# Patient Record
Sex: Female | Born: 2000 | Race: Black or African American | Hispanic: No | Marital: Single | Smoking: Never smoker
Health system: Southern US, Community
[De-identification: ages and names within clinical notes are randomized; demographics above are authoritative.]

## PROBLEM LIST (undated history)

## (undated) DIAGNOSIS — B009 Herpesviral infection, unspecified: Secondary | ICD-10-CM

## (undated) DIAGNOSIS — K8020 Calculus of gallbladder without cholecystitis without obstruction: Secondary | ICD-10-CM

## (undated) DIAGNOSIS — F819 Developmental disorder of scholastic skills, unspecified: Secondary | ICD-10-CM

## (undated) HISTORY — DX: Herpesviral infection, unspecified: B00.9

## (undated) HISTORY — PX: CHOLECYSTECTOMY: SHX55

## (undated) HISTORY — DX: Calculus of gallbladder without cholecystitis without obstruction: K80.20

---
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
## 2019-06-15 NOTE — L&D Delivery Note (Signed)
Delivery Note Labor onset: 01/24/2020  Labor Onset Time: 1100 Complete dilation at 9:14 PM  Onset of pushing: 2115-2245, rested from 2300-0000 and resumed pushing @ 0000 FHR second stage Cat 1 Analgesia/Anesthesia intrapartum: epidural  CNM to room to assess pushing efforts after 60 min of laboring down w/ peanut ball. Vertex noted @ +3 station. Pt prepped for delivery. Guided pushing using tug-of-war method continued w/ L. Fails, RN in lithotomy position. Delivery of fetal head @ 0116. Turtle sign noted, RN x2 assisted pt to supine position w/ immediate transition to [place]. Posterior shoulder palpated. Suprapubic pressure and gentle axile traction did not release anterior shoulder. CNM called for Faculty assistance. Leftwich-Kirby, CNM and Dr. Salomon Mast to bedside. [place] from [place], [place]. Delivery of viable female at 60. Baby immediately to warmer w/ NICU team waiting.   Pt's mother and S/O present for birth. S/O is not FOB  Cord blood sample collected: yes Unable to collect arterial cord blood sample, venous gas sent. Results for GARIMA, CHRONIS (MRN [id number]) as of 01/25/2020 02:35  Ref. Range 01/25/2020 01:20  Bicarbonate Latest Ref Range: 13.0 - 22.0 mmol/L 19.5  Ph Cord Blood (Venous) Latest Ref Range: 7.240 - 7.380  7.372  pCO2 Cord Blood (Venous) Latest Ref Range: 42.0 - 56.0  34.4 (L)    Placenta delivered Schultz, intact, with 3 VC.  Placenta to pathology for maternal temp and fetal tachycardia. Uterine tone boggy w/ brisk bleeding. Pitocin bolus, Cytotec 400 mcg buccal, 600 mcg rectal. Uterus firm and bleeding minimal after uterotonics. Bleeding from laceration ligated w/ 4-0 Vicryl.   Vaginal and right clitoral laceration identified.  Anesthesia: epidural Repair: 4-0 and 2-0 vicryl QBL (mL): 660 Complications: Maternal temp, fetal tachycardia, and shoulder dystocia, decreased ROM of fetal left arm noted. Will continue to monitor newborn's ROM. Will cont maternal IV  ATB until 24 hrs afebrile PP.    APGAR: APGAR (1 MIN): 2   APGAR (5 MINS): 8   APGAR (10 MINS):   Mom to postpartum.  Baby to Couplet care / Skin to Skin at this time. Newborn may need ATB therapy if tachycardia does not resolve during transition.  Roma Schanz MSN, CNM 01/25/2020, 2:13 AM

## 2019-07-16 ENCOUNTER — Encounter (HOSPITAL_COMMUNITY): Payer: Self-pay

## 2019-07-18 LAB — OB RESULTS CONSOLE HIV ANTIBODY (ROUTINE TESTING): HIV: NONREACTIVE

## 2019-07-18 LAB — OB RESULTS CONSOLE ABO/RH: RH Type: POSITIVE

## 2019-07-18 LAB — OB RESULTS CONSOLE HEPATITIS B SURFACE ANTIGEN: Hepatitis B Surface Ag: NEGATIVE

## 2019-07-18 LAB — OB RESULTS CONSOLE GC/CHLAMYDIA
Chlamydia: NEGATIVE
Gonorrhea: NEGATIVE

## 2019-07-18 LAB — OB RESULTS CONSOLE RUBELLA ANTIBODY, IGM: Rubella: IMMUNE

## 2019-07-18 LAB — OB RESULTS CONSOLE RPR: RPR: NONREACTIVE

## 2019-07-30 ENCOUNTER — Encounter (HOSPITAL_COMMUNITY): Payer: Self-pay | Admitting: *Deleted

## 2019-07-30 ENCOUNTER — Emergency Department (HOSPITAL_COMMUNITY)
Admission: EM | Admit: 2019-07-30 | Discharge: 2019-07-30 | Disposition: A | Discharge status: Home / Self Care | Payer: Medicaid Other | Attending: Emergency Medicine | Admitting: Emergency Medicine

## 2019-07-30 ENCOUNTER — Other Ambulatory Visit: Payer: Self-pay

## 2019-07-30 DIAGNOSIS — R42 Dizziness and giddiness: Secondary | ICD-10-CM | POA: Diagnosis not present

## 2019-07-30 DIAGNOSIS — Z3A12 12 weeks gestation of pregnancy: Secondary | ICD-10-CM | POA: Insufficient documentation

## 2019-07-30 DIAGNOSIS — O26891 Other specified pregnancy related conditions, first trimester: Secondary | ICD-10-CM | POA: Diagnosis present

## 2019-07-30 DIAGNOSIS — R55 Syncope and collapse: Secondary | ICD-10-CM

## 2019-07-30 LAB — URINALYSIS, ROUTINE W REFLEX MICROSCOPIC
Bilirubin Urine: NEGATIVE
Glucose, UA: NEGATIVE mg/dL
Hgb urine dipstick: NEGATIVE
Ketones, ur: 20 mg/dL — AB
Nitrite: NEGATIVE
Protein, ur: NEGATIVE mg/dL
Specific Gravity, Urine: 1.021 (ref 1.005–1.030)
pH: 5 (ref 5.0–8.0)

## 2019-07-30 NOTE — ED Provider Notes (Signed)
[facility] EMERGENCY DEPARTMENT Provider Note   CSN: [id number] Arrival date & time: 07/30/19  1004     History Chief Complaint  Patient presents with  . Loss of Consciousness    Emily Lucas is a 19 y.o. female.  Patient who is 3 months pregnant, no complications thus far, states ultrasound and initial labs drawn on 1 February --presents to the emergency department today with near syncopal episode.  Patient was standing up at work this morning and felt lightheaded.  She had tunnel vision and felt down to the ground landing on her rear end.  She did not lose consciousness or hit her head.  She was assisted by coworkers.  Patient states that she has only had some grapes, sour patch kids, and prenatal vitamins this morning.  She has not had any recent nausea or vomiting related to her pregnancy or otherwise.  She has been feeling well without any systemic symptoms of illness.  She denies any family history of arrhythmias or heart problems at a young age.  She does not have any heart issues herself.  States that she otherwise feels well and has returned to baseline.  Patient denies any vaginal bleeding or discharge, back pain or pelvic pain.        Past Medical History:  Diagnosis Date  . Gallstones   . Learning disability    "comprehension"- moxther reported    Patient Active Problem List   Diagnosis Date Noted  . Abnormal cholangiogram   . S/P laparoscopic cholecystectomy 01/25/2017    Past Surgical History:  Procedure Laterality Date  . cholecystecomy  01/24/2017  . CHOLECYSTECTOMY    . ERCP N/A 02/01/2017   Procedure: ENDOSCOPIC RETROGRADE CHOLANGIOPANCREATOGRAPHY (ERCP);  Surgeon: Milus Banister, MD;  Location: [facility] ENDOSCOPY;  Service: Endoscopy;  Laterality: N/A;     OB History    Gravida  1   Para  0   Term  0   Preterm  0   AB  0   Living  0     SAB  0   TAB  0   Ectopic  0   Multiple  0   Live Births               Family History  Problem Relation Age of Onset  . Cancer Paternal Grandfather     Social History   Tobacco Use  . Smoking status: Never Smoker  . Smokeless tobacco: Never Used  Substance Use Topics  . Alcohol use: No    Alcohol/week: 0.0 standard drinks  . Drug use: No    Home Medications Prior to Admission medications   Medication Sig Start Date End Date Taking? Authorizing Provider  amoxicillin (AMOXIL) 500 MG capsule Take 1 capsule (500 mg total) by mouth 2 (two) times daily. 08/23/17   Elnora Morrison, MD  HYDROcodone-acetaminophen (NORCO/VICODIN) 5-325 MG tablet Take 1 tablet by mouth every 6 (six) hours as needed for moderate pain.    [provider]  ondansetron (ZOFRAN ODT) 4 MG disintegrating tablet Take 1 tablet (4 mg total) by mouth every 8 (eight) hours as needed for nausea or vomiting. 01/13/17   Carlisle Cater, PA-C    Allergies    Motrin [ibuprofen]  Review of Systems   Review of Systems  Constitutional: Negative for fever.  HENT: Negative for rhinorrhea and sore throat.   Eyes: Negative for redness.  Respiratory: Negative for cough.   Cardiovascular: Negative for chest pain.  Gastrointestinal: Negative  for abdominal pain, diarrhea, nausea and vomiting.  Genitourinary: Negative for dysuria, pelvic pain, vaginal bleeding and vaginal discharge.  Musculoskeletal: Negative for back pain and myalgias.  Skin: Negative for rash.  Neurological: Positive for light-headedness. Negative for dizziness, weakness, numbness and headaches.    Physical Exam Updated Vital Signs BP (!) 101/57 (BP Location: Right Arm)   Pulse 77   Temp 98.3 F (36.8 C) (Oral)   Resp 16   Ht 5\' 4"  (1.626 m)   Wt 69.4 kg   LMP  (LMP Unknown) Comment: First OB appt 07-16-19  SpO2 99%   BMI 26.26 kg/m   Physical Exam Vitals and nursing note reviewed.  Constitutional:      Appearance: She is well-developed. She is not diaphoretic.  HENT:     Head: Normocephalic and atraumatic.      Mouth/Throat:     Mouth: Mucous membranes are not dry.  Eyes:     Conjunctiva/sclera: Conjunctivae normal.  Neck:     Vascular: Normal carotid pulses. No carotid bruit or JVD.     Trachea: Trachea normal. No tracheal deviation.  Cardiovascular:     Rate and Rhythm: Normal rate and regular rhythm.     Pulses: No decreased pulses.     Heart sounds: Normal heart sounds, S1 normal and S2 normal. No murmur.     Comments: No murmurs heard.  Regular rate and rhythm. Pulmonary:     Effort: Pulmonary effort is normal. No respiratory distress.     Breath sounds: No wheezing.  Chest:     Chest wall: No tenderness.  Abdominal:     General: Bowel sounds are normal.     Palpations: Abdomen is soft.     Tenderness: There is no abdominal tenderness. There is no guarding or rebound.  Musculoskeletal:        General: Normal range of motion.     Cervical back: Normal range of motion and neck supple. No muscular tenderness.  Skin:    General: Skin is warm and dry.     Coloration: Skin is not pale.  Neurological:     Mental Status: She is alert.     ED Results / Procedures / Treatments   Labs (all labs ordered are listed, but only abnormal results are displayed) Labs Reviewed  URINALYSIS, ROUTINE W REFLEX MICROSCOPIC - Abnormal; Notable for the following components:      Result Value   APPearance HAZY (*)    Ketones, ur 20 (*)    Leukocytes,Ua TRACE (*)    Bacteria, UA FEW (*)    All other components within normal limits    ED ECG REPORT   Date: 07/30/2019  Rate: 76  Rhythm: normal sinus rhythm  QRS Axis: normal  Intervals: normal  ST/T Wave abnormalities: normal  Conduction Disutrbances:none  Narrative Interpretation: No WPW, Brugada syndrome, prolonged QT, hypertrophy, heart block  Old EKG Reviewed: none available  I have personally reviewed the EKG tracing and agree with the computerized printout as noted.  Radiology No results found.  Procedures Procedures (including  critical care time)  Medications Ordered in ED Medications - No data to display  ED Course  I have reviewed the triage vital signs and the nursing notes.  Pertinent labs & imaging results that were available during my care of the patient were reviewed by me and considered in my medical decision making (see chart for details).  Patient seen and examined.  Patient looks well.  She is pregnant.  No reported  complications.  She reports an normal ultrasound about 2 weeks ago.  No pain and I have no reason to suspect extrauterine pregnancy today.  Vital signs reviewed and are as follows: BP (!) 101/57 (BP Location: Right Arm)   Pulse 77   Temp 98.3 F (36.8 C) (Oral)   Resp 16   Ht 5\' 4"  (1.626 m)   Wt 69.4 kg   LMP  (LMP Unknown) Comment: First OB appt 07-16-19  SpO2 99%   BMI 26.26 kg/m   Suspect near syncope likely multifactorial, due to poor oral intake, pregnancy status, working while standing.  Will check orthostatics, EKG, UA.  Will encourage fluid intake.  Do not feel that extensive work-up otherwise indicated patient is back to her baseline.  Will reassess.  EKG is normal.   Orthostatic VS for the past 24 hrs:  BP- Lying Pulse- Lying BP- Sitting Pulse- Sitting BP- Standing at 0 minutes Pulse- Standing at 0 minutes  07/30/19 1022 105/61 73 122/68 77 105/77 97   1:04 PM Awaiting UA. Patient drinking water, no problems. Resting in room.   1:09 PM patient updated on results.  She is ready to go home.  Encouraged rest and hydration today.  Encouraged follow-up with OB/GYN.  Encouraged return to the emergency department with chest pain, abdominal pain, vaginal bleeding or discharge, additional episodes of passing out.    MDM Rules/Calculators/A&P                      Patient with near syncopal episode in setting of poor oral intake, early pregnancy, standing while at work today.  No chest pain or concerning symptoms.  No recent illnesses.  No significant vomiting or diarrhea.  EKG  is normal.  Patient is mildly orthostatic.  Urine with some ketones, no definite infection, not a clean-catch.  Patient has been monitored in the emergency department without any worsening.  She is at her baseline and looks well.  She has been drinking without any difficulties.  No indications for blood testing today as I doubt clinical anemia or electrolyte abnormality.    Final Clinical Impression(s) / ED Diagnoses Final diagnoses:  Near syncope    Rx / DC Orders ED Discharge Orders    None       08/01/19, PA-C 07/30/19 1310    08/01/19, MD 07/31/19 [phone]

## 2019-07-30 NOTE — Discharge Instructions (Signed)
Please read and follow all provided instructions.  Your diagnoses today include:  1. Near syncope     Tests performed today include:  EKG - normal  Urine test -no definite infection, mild dehydration   Vital signs. See below for your results today.   Medications prescribed:   None  Take any prescribed medications only as directed.  Home care instructions:  Follow any educational materials contained in this packet.  Please rest and hydrate yourself for the remainder of the day today.  Follow-up instructions: Please follow-up with your primary care provider in the next 3 days for further evaluation of your symptoms.    Return instructions:   Please return to the Emergency Department if you experience worsening symptoms.   Return if you develop any abdominal pain, back pain, vaginal bleeding or discharge.  Return if you have additional episodes of passing out or nearly passing out.  Please return if you have any other emergent concerns.  Additional Information:  Your vital signs today were: BP 104/60 (BP Location: Right Arm)   Pulse 71   Temp 97.8 F (36.6 C) (Oral)   Resp 19   Ht 5\' 4"  (1.626 m)   Wt 69.4 kg   LMP  (LMP Unknown) Comment: First OB appt 07-16-19  SpO2 100%   BMI 26.26 kg/m  If your blood pressure (BP) was elevated above 135/85 this visit, please have this repeated by your doctor within one month. --------------

## 2019-07-30 NOTE — ED Triage Notes (Signed)
Pt reports while at work pt felt dizzy and sat down on floor. Pt does remember sitting down but reported she heard some one saying she needs help.

## 2019-07-30 NOTE — ED Notes (Signed)
Patient verbalizes understanding of discharge instructions . Opportunity for questions and answers were provided . Armband removed by staff ,Pt discharged from ED. W/C  offered at D/C  and Declined W/C at D/C and was escorted to lobby by RN.  

## 2019-08-16 ENCOUNTER — Other Ambulatory Visit (HOSPITAL_COMMUNITY): Payer: Self-pay | Admitting: Obstetrics and Gynecology

## 2019-08-16 DIAGNOSIS — Z3A19 19 weeks gestation of pregnancy: Secondary | ICD-10-CM

## 2019-08-16 DIAGNOSIS — Z3689 Encounter for other specified antenatal screening: Secondary | ICD-10-CM

## 2019-09-10 ENCOUNTER — Other Ambulatory Visit: Payer: Self-pay

## 2019-09-10 ENCOUNTER — Other Ambulatory Visit (HOSPITAL_COMMUNITY): Payer: Self-pay | Admitting: *Deleted

## 2019-09-10 ENCOUNTER — Ambulatory Visit (HOSPITAL_COMMUNITY)
Admission: RE | Admit: 2019-09-10 | Discharge: 2019-09-10 | Disposition: A | Discharge status: Home / Self Care | Payer: Medicaid Other | Source: Ambulatory Visit | Attending: Obstetrics and Gynecology | Admitting: Obstetrics and Gynecology

## 2019-09-10 DIAGNOSIS — Z3689 Encounter for other specified antenatal screening: Secondary | ICD-10-CM | POA: Diagnosis present

## 2019-09-10 DIAGNOSIS — Z362 Encounter for other antenatal screening follow-up: Secondary | ICD-10-CM

## 2019-09-10 DIAGNOSIS — Z3A19 19 weeks gestation of pregnancy: Secondary | ICD-10-CM | POA: Diagnosis present

## 2019-09-10 DIAGNOSIS — Z3A20 20 weeks gestation of pregnancy: Secondary | ICD-10-CM | POA: Diagnosis not present

## 2019-10-05 ENCOUNTER — Encounter (HOSPITAL_COMMUNITY): Payer: Self-pay | Admitting: *Deleted

## 2019-10-09 ENCOUNTER — Ambulatory Visit (HOSPITAL_COMMUNITY)
Admission: RE | Admit: 2019-10-09 | Discharge: 2019-10-09 | Disposition: A | Discharge status: Home / Self Care | Payer: Medicaid Other | Source: Ambulatory Visit | Attending: Obstetrics and Gynecology | Admitting: Obstetrics and Gynecology

## 2019-10-09 ENCOUNTER — Other Ambulatory Visit: Payer: Self-pay

## 2019-10-09 ENCOUNTER — Ambulatory Visit (HOSPITAL_COMMUNITY): Payer: Medicaid Other

## 2019-10-09 DIAGNOSIS — Z3A24 24 weeks gestation of pregnancy: Secondary | ICD-10-CM

## 2019-10-09 DIAGNOSIS — Z362 Encounter for other antenatal screening follow-up: Secondary | ICD-10-CM

## 2019-11-20 LAB — OB RESULTS CONSOLE HIV ANTIBODY (ROUTINE TESTING): HIV: NONREACTIVE

## 2019-11-20 LAB — OB RESULTS CONSOLE RPR: RPR: NONREACTIVE

## 2020-01-07 LAB — OB RESULTS CONSOLE GC/CHLAMYDIA
Chlamydia: NEGATIVE
Gonorrhea: NEGATIVE

## 2020-01-07 LAB — OB RESULTS CONSOLE GBS: GBS: POSITIVE

## 2020-01-22 ENCOUNTER — Other Ambulatory Visit: Payer: Self-pay

## 2020-01-22 ENCOUNTER — Encounter (HOSPITAL_COMMUNITY): Payer: Self-pay | Admitting: Obstetrics & Gynecology

## 2020-01-22 ENCOUNTER — Inpatient Hospital Stay (HOSPITAL_COMMUNITY)
Admission: AD | Admit: 2020-01-22 | Discharge: 2020-01-22 | Disposition: A | Discharge status: Home / Self Care | Payer: Medicaid Other | Attending: Obstetrics & Gynecology | Admitting: Obstetrics & Gynecology

## 2020-01-22 DIAGNOSIS — Z3A39 39 weeks gestation of pregnancy: Secondary | ICD-10-CM

## 2020-01-22 DIAGNOSIS — Z3403 Encounter for supervision of normal first pregnancy, third trimester: Secondary | ICD-10-CM | POA: Insufficient documentation

## 2020-01-22 DIAGNOSIS — Z0371 Encounter for suspected problem with amniotic cavity and membrane ruled out: Secondary | ICD-10-CM | POA: Diagnosis not present

## 2020-01-22 DIAGNOSIS — Z3689 Encounter for other specified antenatal screening: Secondary | ICD-10-CM

## 2020-01-22 LAB — POCT FERN TEST: POCT Fern Test: NEGATIVE

## 2020-01-22 NOTE — MAU Note (Addendum)
I have communicated with Donia Ast, NP and reviewed vital signs:  Vitals:   01/22/20 1942 01/22/20 2123  BP: 109/68 113/72  Pulse: 89 78  Resp:  15  Temp:    SpO2: 99% 100%    Vaginal exam:  Dilation: 1 Effacement (%): 50 Cervical Position: Posterior Station: -3 Presentation: Vertex Exam by:: Erle Crocker, RN,   Also reviewed contraction pattern and that non-stress test is reactive.  It has been documented that patient is contracting every 7 minutes with no cervical change over 1 hour not indicating active labor.  Patient denies any other complaints.  Based on this report provider has given order for discharge.  A discharge order and diagnosis entered by a provider.   Labor discharge instructions reviewed with patient.

## 2020-01-22 NOTE — MAU Provider Note (Signed)
S: Ms. Emily Lucas is a 19 y.o. G1P0000 at [redacted]w[redacted]d  who presents to MAU today complaining of leaking of fluid since 4days. Patient reports she feels "wetter than usual" and endorses a white/clear discharge that does not soak her clothes or result in large gushes. She denies vaginal bleeding. She endorses contractions in her back. She reports normal fetal movement.    O: BP 109/68   Pulse 89   Temp 98.5 F (36.9 C) (Oral)   Resp 15   Ht 5\' 4"  (1.626 m)   Wt 75.3 kg   LMP  (LMP Unknown) Comment: First OB appt 07-16-19  SpO2 99%   BMI 28.49 kg/m    Patient Vitals for the past 24 hrs:  BP Temp Temp src Pulse Resp SpO2 Height Weight  01/22/20 1942 109/68 -- -- 89 -- 99 % -- --  01/22/20 1930 110/64 98.5 F (36.9 C) Oral 88 15 100 % 5\' 4"  (1.626 m) 75.3 kg   GENERAL: Well-developed, well-nourished female in no acute distress.  HEAD: Normocephalic, atraumatic.  CHEST: Normal effort of breathing, regular heart rate ABDOMEN: Soft, nontender, gravid PELVIC: Normal external female genitalia. Vagina is pink and rugated. Cervix with normal contour, no lesions. Normal discharge.  No pooling.   Cervical exam:  Dilation: 1 Effacement (%): 50 Cervical Position: Posterior Station: -3 Presentation: Vertex Exam by:: , RN   Fetal Monitoring: reactive Baseline: 135 Variability: moderate Accelerations: present, 15x15 Decelerations: absent Contractions: q4-75min, pt rates as 1/10  Results for orders placed or performed during the hospital encounter of 01/22/20 (from the past 24 hour(s))  Fern Test     Status: None   Collection Time: 01/22/20  8:23 PM  Result Value Ref Range   POCT Fern Test Negative = intact amniotic membranes     A: SIUP at [redacted]w[redacted]d  Membranes intact  P: Report given to RN to contact MD on call for further instructions  Lya Holben, 03/23/20, NP 01/22/2020 8:47 PM

## 2020-01-22 NOTE — MAU Note (Signed)
Pt reports cramping x 3 days off/on. Also wants to see if her water has broken because she feels a lot of wetness . Reports good fetal movement.

## 2020-01-22 NOTE — Discharge Instructions (Signed)

## 2020-01-24 ENCOUNTER — Inpatient Hospital Stay (HOSPITAL_COMMUNITY): Payer: Medicaid Other | Admitting: Anesthesiology

## 2020-01-24 ENCOUNTER — Inpatient Hospital Stay (HOSPITAL_COMMUNITY)
Admission: AD | Admit: 2020-01-24 | Discharge: 2020-01-27 | DRG: 805 | Disposition: A | Discharge status: Home / Self Care | Payer: Medicaid Other | Attending: Obstetrics and Gynecology | Admitting: Obstetrics and Gynecology

## 2020-01-24 ENCOUNTER — Other Ambulatory Visit: Payer: Self-pay

## 2020-01-24 ENCOUNTER — Encounter (HOSPITAL_COMMUNITY): Payer: Self-pay | Admitting: Obstetrics and Gynecology

## 2020-01-24 DIAGNOSIS — O411230 Chorioamnionitis, third trimester, not applicable or unspecified: Secondary | ICD-10-CM | POA: Diagnosis present

## 2020-01-24 DIAGNOSIS — O99824 Streptococcus B carrier state complicating childbirth: Secondary | ICD-10-CM | POA: Diagnosis present

## 2020-01-24 DIAGNOSIS — O9081 Anemia of the puerperium: Secondary | ICD-10-CM | POA: Diagnosis not present

## 2020-01-24 DIAGNOSIS — Z20822 Contact with and (suspected) exposure to covid-19: Secondary | ICD-10-CM | POA: Diagnosis present

## 2020-01-24 DIAGNOSIS — Z3400 Encounter for supervision of normal first pregnancy, unspecified trimester: Secondary | ICD-10-CM

## 2020-01-24 DIAGNOSIS — O26893 Other specified pregnancy related conditions, third trimester: Secondary | ICD-10-CM | POA: Diagnosis present

## 2020-01-24 DIAGNOSIS — Z3A39 39 weeks gestation of pregnancy: Secondary | ICD-10-CM

## 2020-01-24 DIAGNOSIS — D62 Acute posthemorrhagic anemia: Secondary | ICD-10-CM | POA: Diagnosis not present

## 2020-01-24 DIAGNOSIS — O9902 Anemia complicating childbirth: Secondary | ICD-10-CM | POA: Diagnosis present

## 2020-01-24 DIAGNOSIS — O411231 Chorioamnionitis, third trimester, fetus 1: Secondary | ICD-10-CM | POA: Diagnosis present

## 2020-01-24 LAB — CBC
HCT: 33.5 % — ABNORMAL LOW (ref 36.0–46.0)
Hemoglobin: 10.7 g/dL — ABNORMAL LOW (ref 12.0–15.0)
MCH: 28.2 pg (ref 26.0–34.0)
MCHC: 31.9 g/dL (ref 30.0–36.0)
MCV: 88.4 fL (ref 80.0–100.0)
Platelets: 206 10*3/uL (ref 150–400)
RBC: 3.79 MIL/uL — ABNORMAL LOW (ref 3.87–5.11)
RDW: 13.6 % (ref 11.5–15.5)
WBC: 13.9 10*3/uL — ABNORMAL HIGH (ref 4.0–10.5)
nRBC: 0 % (ref 0.0–0.2)

## 2020-01-24 LAB — SARS CORONAVIRUS 2 BY RT PCR (HOSPITAL ORDER, PERFORMED IN ~~LOC~~ HOSPITAL LAB): SARS Coronavirus 2: NEGATIVE

## 2020-01-24 LAB — POCT FERN TEST: POCT Fern Test: POSITIVE

## 2020-01-24 MED ORDER — LIDOCAINE HCL (PF) 1 % IJ SOLN: 30.0000 mL | INTRAMUSCULAR | Status: DC | PRN

## 2020-01-24 MED ORDER — OXYTOCIN-SODIUM CHLORIDE 30-0.9 UT/500ML-% IV SOLN: 2.5000 [IU]/h | INTRAVENOUS | Status: DC

## 2020-01-24 MED ORDER — PHENYLEPHRINE 40 MCG/ML (10ML) SYRINGE FOR IV PUSH (FOR BLOOD PRESSURE SUPPORT): 80.0000 ug | PREFILLED_SYRINGE | INTRAVENOUS | Status: DC | PRN

## 2020-01-24 MED ORDER — SODIUM CHLORIDE (PF) 0.9 % IJ SOLN
INTRAMUSCULAR | Status: DC | PRN
  Administered 2020-01-24: 12 mL/h via EPIDURAL

## 2020-01-24 MED ORDER — ONDANSETRON HCL 4 MG/2ML IJ SOLN: 4.0000 mg | Freq: Four times a day (QID) | INTRAMUSCULAR | Status: DC | PRN

## 2020-01-24 MED ORDER — OXYTOCIN-SODIUM CHLORIDE 30-0.9 UT/500ML-% IV SOLN
1.0000 m[IU]/min | INTRAVENOUS | Status: DC
  Administered 2020-01-24: 1 m[IU]/min via INTRAVENOUS
  Filled 2020-01-24: qty 500

## 2020-01-24 MED ORDER — FENTANYL-BUPIVACAINE-NACL 0.5-0.125-0.9 MG/250ML-% EP SOLN
12.0000 mL/h | EPIDURAL | Status: DC | PRN
  Filled 2020-01-24: qty 250

## 2020-01-24 MED ORDER — SOD CITRATE-CITRIC ACID 500-334 MG/5ML PO SOLN: 30.0000 mL | ORAL | Status: DC | PRN

## 2020-01-24 MED ORDER — TERBUTALINE SULFATE 1 MG/ML IJ SOLN: 0.2500 mg | Freq: Once | INTRAMUSCULAR | Status: DC | PRN

## 2020-01-24 MED ORDER — OXYTOCIN BOLUS FROM INFUSION
333.0000 mL | Freq: Once | INTRAVENOUS | Status: AC
  Administered 2020-01-25: 333 mL via INTRAVENOUS

## 2020-01-24 MED ORDER — LACTATED RINGERS IV SOLN
500.0000 mL | INTRAVENOUS | Status: DC | PRN
  Administered 2020-01-24: 1000 mL via INTRAVENOUS

## 2020-01-24 MED ORDER — FENTANYL CITRATE (PF) 100 MCG/2ML IJ SOLN: 50.0000 ug | INTRAMUSCULAR | Status: DC | PRN

## 2020-01-24 MED ORDER — DIPHENHYDRAMINE HCL 50 MG/ML IJ SOLN: 12.5000 mg | INTRAMUSCULAR | Status: DC | PRN

## 2020-01-24 MED ORDER — LIDOCAINE HCL (PF) 1 % IJ SOLN
INTRAMUSCULAR | Status: DC | PRN
  Administered 2020-01-24: 6 mL via EPIDURAL
  Administered 2020-01-24: 4 mL via EPIDURAL

## 2020-01-24 MED ORDER — SODIUM CHLORIDE 0.9 % IV SOLN
3.0000 g | Freq: Four times a day (QID) | INTRAVENOUS | Status: DC
  Administered 2020-01-24 – 2020-01-26 (×7): 3 g via INTRAVENOUS
  Filled 2020-01-24: qty 8
  Filled 2020-01-24: qty 3
  Filled 2020-01-24 (×7): qty 8
  Filled 2020-01-24 (×3): qty 3
  Filled 2020-01-24: qty 8
  Filled 2020-01-24: qty 3

## 2020-01-24 MED ORDER — EPHEDRINE 5 MG/ML INJ: 10.0000 mg | INTRAVENOUS | Status: DC | PRN

## 2020-01-24 MED ORDER — ACETAMINOPHEN 160 MG/5ML PO SOLN
1000.0000 mg | Freq: Three times a day (TID) | ORAL | Status: DC | PRN
  Administered 2020-01-25 – 2020-01-26 (×3): 1000 mg via ORAL
  Filled 2020-01-24 (×3): qty 40.6

## 2020-01-24 MED ORDER — PENICILLIN G POT IN DEXTROSE 60000 UNIT/ML IV SOLN
3.0000 10*6.[IU] | INTRAVENOUS | Status: DC
  Filled 2020-01-24: qty 50

## 2020-01-24 MED ORDER — FLEET ENEMA 7-19 GM/118ML RE ENEM: 1.0000 | ENEMA | RECTAL | Status: DC | PRN

## 2020-01-24 MED ORDER — LACTATED RINGERS IV SOLN
500.0000 mL | Freq: Once | INTRAVENOUS | Status: AC
  Administered 2020-01-24: 500 mL via INTRAVENOUS

## 2020-01-24 MED ORDER — LACTATED RINGERS IV BOLUS
1000.0000 mL | Freq: Once | INTRAVENOUS | Status: AC
  Administered 2020-01-24: 1000 mL via INTRAVENOUS

## 2020-01-24 MED ORDER — ACETAMINOPHEN 160 MG/5ML PO SOLN
650.0000 mg | Freq: Four times a day (QID) | ORAL | Status: DC | PRN
  Administered 2020-01-24 (×2): 650 mg via ORAL
  Filled 2020-01-24 (×3): qty 20.3

## 2020-01-24 MED ORDER — LACTATED RINGERS IV SOLN: INTRAVENOUS | Status: DC

## 2020-01-24 MED ORDER — SODIUM CHLORIDE 0.9 % IV SOLN
5.0000 10*6.[IU] | Freq: Once | INTRAVENOUS | Status: AC
  Administered 2020-01-24: 5 10*6.[IU] via INTRAVENOUS
  Filled 2020-01-24: qty 5

## 2020-01-24 NOTE — MAU Note (Signed)
.   Emily Lucas is a 19 y.o. at [redacted]w[redacted]d here in MAU reporting: ctx that started 1700 yesterday evening. She states that they are closer together and have increased in intensity. She also states her water broke at 0830 clear fluid. Endorses good fetal movement.   Pain score: 5 Vitals:   01/24/20 0858  BP: 118/66  Pulse: (!) 113  Resp: 19  Temp: 99.5 F (37.5 C)  SpO2: 100%     FHT: 185

## 2020-01-24 NOTE — Progress Notes (Signed)
Subjective:    Requesting an epidural. Discussed change in antibiotic therapy to treat possible chorio. Pt agrees w/ plan.   Objective:    VS: BP 124/74   Pulse (!) 114   Temp 99.3 F (37.4 C) (Oral)   Resp 20   Ht 5\' 4"  (1.626 m)   Wt 75.8 kg   LMP  (LMP Unknown) Comment: First OB appt 07-16-19  SpO2 100%   BMI 28.67 kg/m  FHR : baseline 155 / variability moderate / accelerations present / absemt decelerations Toco: contractions every 2-4 minutes  Membranes: SROM since 1700 01/23/20. No fluid noted on exam, palpable forebag noted.   Dilation: 3 Effacement (%): 80 Cervical Position: Anterior Station: -2 Presentation: Vertex Exam by:: [email or web address], CNM  Assessment/Plan:   19 y.o. G1P0000 [redacted]w[redacted]d SROM Teen pregnancy Possible chorio Labor: Latent labor, will start Pitocin after epidural Preeclampsia:  no signs or symptoms of toxicity Fetal Wellbeing:  Category I Pain Control:  Epidural I/D:  GBS pos, PCN loading dose given, switching to Unasyn for poss chorio Anticipated MOD:  NSVD  Will start Pitocin after epidural  [redacted]w[redacted]d MSN, CNM 01/24/2020 12:55 PM

## 2020-01-24 NOTE — Anesthesia Preprocedure Evaluation (Signed)
Anesthesia Evaluation  Patient identified by MRN, date of birth, ID band Patient awake    Reviewed: Allergy & Precautions, H&P , NPO status , Patient's Chart, lab work & pertinent test results  History of Anesthesia Complications Negative for: history of anesthetic complications  Airway Mallampati: II  TM Distance: >3 FB Neck ROM: full    Dental no notable dental hx.    Pulmonary neg pulmonary ROS,    Pulmonary exam normal        Cardiovascular negative cardio ROS Normal cardiovascular exam Rhythm:regular Rate:Normal     Neuro/Psych negative neurological ROS  negative psych ROS   GI/Hepatic negative GI ROS, Neg liver ROS,   Endo/Other  negative endocrine ROS  Renal/GU negative Renal ROS  negative genitourinary   Musculoskeletal   Abdominal   Peds  Hematology  (+) Blood dyscrasia, anemia ,   Anesthesia Other Findings   Reproductive/Obstetrics (+) Pregnancy                             Anesthesia Physical Anesthesia Plan  ASA: II  Anesthesia Plan: Epidural   Post-op Pain Management:    Induction:   PONV Risk Score and Plan:   Airway Management Planned:   Additional Equipment:   Intra-op Plan:   Post-operative Plan:   Informed Consent: I have reviewed the patients History and Physical, chart, labs and discussed the procedure including the risks, benefits and alternatives for the proposed anesthesia with the patient or authorized representative who has indicated his/her understanding and acceptance.       Plan Discussed with:   Anesthesia Plan Comments:         Anesthesia Quick Evaluation  

## 2020-01-24 NOTE — Progress Notes (Signed)
Subjective:    Called to bedside for pt's report of increased rectal pressure. Voluntary pushing initiated w/ CNM present. CNM remains at bedside for first 75 min of coached pushing.   Objective:    VS: BP (!) 108/57   Pulse (!) 106   Temp 99.6 F (37.6 C) (Axillary)   Resp 18   Ht 5\' 4"  (1.626 m)   Wt 75.8 kg   LMP  (LMP Unknown) Comment: First OB appt 07-16-19  SpO2 100%   BMI 28.67 kg/m  FHR : baseline 130 / variability moderate / accelerations present / absent, occasional early decelerations Toco: contractions every 2-6 minutes  Membranes: SROM, clear Dilation: 10 Dilation Complete Date: 01/24/20 Dilation Complete Time: 2114 Effacement (%): 90 Cervical Position: Anterior Station: Plus 2 Presentation: Vertex Exam by:: [email or web address], CNM Pitocin 6 mU/min  Assessment/Plan:   19 y.o. G1P0000 [redacted]w[redacted]d SROM Teen pregnancy Chorio- plan for ATB PP Labor: Progressing normally Preeclampsia:  no signs or symptoms of toxicity Fetal Wellbeing:  Category I Pain Control:  Epidural I/D:  GBS pos, adequately treated Anticipated MOD:  NSVD  [redacted]w[redacted]d MSN, CNM 01/24/2020 10:44 PM

## 2020-01-24 NOTE — Progress Notes (Addendum)
Subjective:    Comfortable w/ epidural. Mother and S/O present. Pt has informed RN that S/O is not FOB.   Objective:    VS: BP 123/74   Pulse (!) 115   Temp (!) 100.8 F (38.2 C) (Axillary) Comment (Src): pt drinking ice water  Resp 18   Ht 5\' 4"  (1.626 m)   Wt 75.8 kg   LMP  (LMP Unknown) Comment: First OB appt 07-16-19  SpO2 100%   BMI 28.67 kg/m  FHR : baseline 155 / variability moderate / accelerations present / absent decelerations Toco: contractions every 1-2 minutes Membranes: SROM, clear Dilation: 7 Effacement (%): 90 Cervical Position: Anterior Station: 0, Plus 1 Presentation: Vertex Exam by:: [email or web address], CNM Pitocin 4 mU/min   Assessment/Plan:   19 y.o. G1P0000 [redacted]w[redacted]d SROM Teen pregnancy Possible chorioamnionitis Labor: Progressing normally on Pitocin Preeclampsia:  no signs or symptoms of toxicity Fetal Wellbeing:  Category I Pain Control:  Epidural I/D:  GBS postivie, adequate treatment Anticipated MOD:  NSVD  [redacted]w[redacted]d MSN, CNM 01/24/2020 6:27 PM

## 2020-01-24 NOTE — Anesthesia Procedure Notes (Signed)
Epidural Patient location during procedure: OB Start time: 01/24/2020 12:54 PM End time: 01/24/2020 1:06 PM  Staffing Anesthesiologist: Lucretia Kern, MD Performed: anesthesiologist   Preanesthetic Checklist Completed: patient identified, IV checked, risks and benefits discussed, monitors and equipment checked, pre-op evaluation and timeout performed  Epidural Patient position: sitting Prep: DuraPrep Patient monitoring: heart rate, continuous pulse ox and blood pressure Approach: midline Location: L3-L4 Injection technique: LOR air  Needle:  Needle type: Tuohy  Needle gauge: 17 G Needle length: 9 cm Needle insertion depth: 7 cm Catheter type: closed end flexible Catheter size: 19 Gauge Catheter at skin depth: 12 cm Test dose: negative  Assessment Events: blood not aspirated, injection not painful, no injection resistance, no paresthesia and negative IV test  Additional Notes Reason for block:procedure for pain

## 2020-01-24 NOTE — H&P (Addendum)
OB ADMISSION/ HISTORY & PHYSICAL:  Admission Date: 01/24/2020  8:43 AM  Admit Diagnosis: SROM  Emily Lucas is a 19 y.o. female G1P0000 [redacted]w[redacted]d presenting for LOF since 0830 today. Endorses active FM, denies vaginal bleeding. Ctx began @ 1700 on 01/23/20. Pt states she may have been leaking clear fluid since 01/23/20 @ 1700, but did not think that she had ruptured because she did not feel a gush of fluid. Fetal tachycardia noted on admission, resolved after IV fluid bolus. GBS prophylaxis started in MAU while pt waiting for bed on L&D. Oral Tylenol given for maternal temp. Discussed plan w/ Dr. Su Hilt, will add Unasyn for poss chorio.  History of current pregnancy: G1P0000   Patient entered care with [facility] at 12+2 wks.   EDC of 01/26/20 was established by [place] @ 12+2   Anatomy scan:  20 wks, with normal findings and limited views of heart, anatomy complete @ 24 wks Antenatal testing: N/A Last evaluation: 37 +6 wks, Vtx, fundal placenta, AFI 16.5, EFW 8# 0oz, 84% Significant prenatal events:  Patient Active Problem List   Diagnosis Date Noted  . Normal labor 01/24/2020  . Supervision of normal first teen pregnancy 01/24/2020  . Chorioamnionitis in third trimester, fetus 1 01/24/2020  . Abnormal cholangiogram   . S/P laparoscopic cholecystectomy 01/25/2017    Prenatal Labs: ABO, Rh: --/--/O POS (08/12 0915) Antibody: NEG (08/12 0915) Rubella:   immune RPR:   NR HBsAg:  NR HIV:   NR GTT: passed 1 hr GBS:   positive GC/CHL: Chlamydia positive in 1st trimester, neg TOC, and neg in 3rd trimester Genetics: low-risk female, neg Horizon Tdap/influenza vaccines: Flu and tdap given during preg   OB History  Gravida Para Term Preterm AB Living  1 0 0 0 0 0  SAB TAB Ectopic Multiple Live Births  0 0 0 0      # Outcome Date GA Lbr Len/2nd Weight Sex Delivery Anes PTL Lv  1 Current             Medical / Surgical History: Past medical history:  Past Medical History:  Diagnosis Date  .  Gallstones   . Learning disability    "comprehension"- moxther reported    Past surgical history:  Past Surgical History:  Procedure Laterality Date  . cholecystecomy  01/24/2017  . CHOLECYSTECTOMY    . ERCP N/A 02/01/2017   Procedure: ENDOSCOPIC RETROGRADE CHOLANGIOPANCREATOGRAPHY (ERCP);  Surgeon: Rachael Fee, MD;  Location[facility] ENDOSCOPY;  Service: Endoscopy;  Laterality: N/A;   Family History:  Family History  Problem Relation Age of Onset  . Cancer Paternal Grandfather     Social History:  reports that she has never smoked. She has never used smokeless tobacco. She reports that she does not drink alcohol and does not use drugs.  Allergies: Motrin [ibuprofen]   Current Medications at time of admission:  Prior to Admission medications   Medication Sig Start Date End Date Taking? Authorizing Provider  Prenatal Vit-Fe Fumarate-FA (MULTIVITAMIN-PRENATAL) 27-0.8 MG TABS tablet Take 1 tablet by mouth daily at 12 noon.   Yes [provider]  amoxicillin (AMOXIL) 500 MG capsule Take 1 capsule (500 mg total) by mouth 2 (two) times daily. 08/23/17   Blane Ohara, MD  HYDROcodone-acetaminophen (NORCO/VICODIN) 5-325 MG tablet Take 1 tablet by mouth every 6 (six) hours as needed for moderate pain.    [provider]  ondansetron (ZOFRAN ODT) 4 MG disintegrating tablet Take 1 tablet (4 mg total) by mouth  every 8 (eight) hours as needed for nausea or vomiting. 01/13/17   Renne Crigler, PA-C    Review of Systems: Constitutional: Negative   HENT: Negative   Eyes: Negative   Respiratory: Negative   Cardiovascular: Negative   Gastrointestinal: Negative  Genitourinary: neg for bloody show, pos for LOF   Musculoskeletal: Negative   Skin: Negative   Neurological: Negative   Endo/Heme/Allergies: Negative   Psychiatric/Behavioral: Negative    Physical Exam: VS: Blood pressure 118/66, pulse (!) 114, temperature 99.5 F (37.5 C), temperature source Oral, resp. rate 19,  weight 75.9 kg, SpO2 100 %. AAO x3, no signs of distress Cardiovascular: RRR Respiratory: Lung fields clear to ausculation GU/GI: Abdomen gravid, non-tender, non-distended, active FM Extremities: no edema, negative for pain, tenderness, and cords  Cervical exam: deferred on admission FHR: baseline rate 170 / variability moderate / accelerations absent / absent decelerations TOCO: 2-4 min   Prenatal Transfer Tool  Maternal Diabetes: No Genetic Screening: Normal Maternal Ultrasounds/Referrals: Normal Fetal Ultrasounds or other Referrals:  None Maternal Substance Abuse:  No Significant Maternal Medications:  None Significant Maternal Lab Results: Group B Strep positive    Assessment: 19 y.o. G1P0000 [redacted]w[redacted]d SROM Teen pregnancy Possible chorio  Latent stage of labor FHR category 2, for baseline of 170 GBS positive Pain management plan: Epidural   Plan:  Admit to L&D Routine admission orders PCN for GBS prophylaxis LR bolus for Cat 2 fetal surveillance Epidural PRN  Dr Su Hilt aware of admission, status, and plan of care  Roma Schanz MSN, CNM 01/24/2020 9:30 AM

## 2020-01-25 ENCOUNTER — Encounter (HOSPITAL_COMMUNITY): Payer: Self-pay | Admitting: Obstetrics and Gynecology

## 2020-01-25 DIAGNOSIS — O9902 Anemia complicating childbirth: Secondary | ICD-10-CM | POA: Diagnosis present

## 2020-01-25 LAB — CBC
HCT: 23.7 % — ABNORMAL LOW (ref 36.0–46.0)
Hemoglobin: 7.4 g/dL — ABNORMAL LOW (ref 12.0–15.0)
MCH: 27.4 pg (ref 26.0–34.0)
MCHC: 31.2 g/dL (ref 30.0–36.0)
MCV: 87.8 fL (ref 80.0–100.0)
Platelets: 190 K/uL (ref 150–400)
RBC: 2.7 MIL/uL — ABNORMAL LOW (ref 3.87–5.11)
RDW: 13.7 % (ref 11.5–15.5)
WBC: 25.3 K/uL — ABNORMAL HIGH (ref 4.0–10.5)
nRBC: 0 % (ref 0.0–0.2)

## 2020-01-25 LAB — RPR: RPR Ser Ql: NONREACTIVE

## 2020-01-25 MED ORDER — SIMETHICONE 80 MG PO CHEW: 80.0000 mg | CHEWABLE_TABLET | ORAL | Status: DC | PRN

## 2020-01-25 MED ORDER — BENZOCAINE-MENTHOL 20-0.5 % EX AERO
1.0000 "application " | INHALATION_SPRAY | CUTANEOUS | Status: DC | PRN
  Filled 2020-01-25: qty 56

## 2020-01-25 MED ORDER — SENNOSIDES-DOCUSATE SODIUM 8.6-50 MG PO TABS
2.0000 | ORAL_TABLET | ORAL | Status: DC
  Administered 2020-01-26: 2 via ORAL
  Filled 2020-01-25: qty 2

## 2020-01-25 MED ORDER — LACTATED RINGERS IV BOLUS
1000.0000 mL | Freq: Once | INTRAVENOUS | Status: AC
  Administered 2020-01-25: 1000 mL via INTRAVENOUS

## 2020-01-25 MED ORDER — TETANUS-DIPHTH-ACELL PERTUSSIS 5-2.5-18.5 LF-MCG/0.5 IM SUSP: 0.5000 mL | Freq: Once | INTRAMUSCULAR | Status: DC

## 2020-01-25 MED ORDER — DIBUCAINE (PERIANAL) 1 % EX OINT: 1.0000 "application " | TOPICAL_OINTMENT | CUTANEOUS | Status: DC | PRN

## 2020-01-25 MED ORDER — DIPHENHYDRAMINE HCL 25 MG PO CAPS: 25.0000 mg | ORAL_CAPSULE | Freq: Four times a day (QID) | ORAL | Status: DC | PRN

## 2020-01-25 MED ORDER — MISOPROSTOL 200 MCG PO TABS
ORAL_TABLET | ORAL | Status: AC
  Administered 2020-01-25: 400 ug via ORAL
  Filled 2020-01-25: qty 5

## 2020-01-25 MED ORDER — COCONUT OIL OIL: 1.0000 "application " | TOPICAL_OIL | Status: DC | PRN

## 2020-01-25 MED ORDER — MAGNESIUM OXIDE 400 (241.3 MG) MG PO TABS
400.0000 mg | ORAL_TABLET | Freq: Every day | ORAL | Status: DC
  Administered 2020-01-26 – 2020-01-27 (×2): 400 mg via ORAL
  Filled 2020-01-25 (×3): qty 1

## 2020-01-25 MED ORDER — WITCH HAZEL-GLYCERIN EX PADS: 1.0000 "application " | MEDICATED_PAD | CUTANEOUS | Status: DC | PRN

## 2020-01-25 MED ORDER — SODIUM CHLORIDE 0.9 % IV SOLN
510.0000 mg | Freq: Once | INTRAVENOUS | Status: AC
  Administered 2020-01-25: 510 mg via INTRAVENOUS
  Filled 2020-01-25: qty 17

## 2020-01-25 MED ORDER — POLYSACCHARIDE IRON COMPLEX 150 MG PO CAPS
150.0000 mg | ORAL_CAPSULE | Freq: Every day | ORAL | Status: DC
  Administered 2020-01-25 – 2020-01-27 (×3): 150 mg via ORAL
  Filled 2020-01-25 (×3): qty 1

## 2020-01-25 MED ORDER — ONDANSETRON HCL 4 MG/2ML IJ SOLN: 4.0000 mg | INTRAMUSCULAR | Status: DC | PRN

## 2020-01-25 MED ORDER — PRENATAL MULTIVITAMIN CH
1.0000 | ORAL_TABLET | Freq: Every day | ORAL | Status: DC
  Filled 2020-01-25: qty 1

## 2020-01-25 MED ORDER — MISOPROSTOL 200 MCG PO TABS
600.0000 ug | ORAL_TABLET | Freq: Once | ORAL | Status: AC
  Administered 2020-01-25: 600 ug via RECTAL

## 2020-01-25 MED ORDER — ONDANSETRON HCL 4 MG PO TABS: 4.0000 mg | ORAL_TABLET | ORAL | Status: DC | PRN

## 2020-01-25 MED ORDER — MISOPROSTOL 200 MCG PO TABS: 400.0000 ug | ORAL_TABLET | Freq: Once | ORAL | Status: AC

## 2020-01-25 NOTE — Lactation Note (Addendum)
This note was copied from a baby's chart. Lactation Consultation Note Attempted to see mom, mom sleeping. Baby 4 hrs old.  Patient Name: Emily Lucas [id number] Date: 01/25/2020     Maternal Data    Feeding Feeding Type: Breast Fed  LATCH Score Latch: Repeated attempts needed to sustain latch, nipple held in mouth throughout feeding, stimulation needed to elicit sucking reflex.  Audible Swallowing: None  Type of Nipple: Flat  Comfort (Breast/Nipple): Soft / non-tender  Hold (Positioning): Full assist, staff holds infant at breast  LATCH Score: 4  Interventions Interventions: Assisted with latch;Skin to skin  Lactation Tools Discussed/Used     Consult Status      Starlee Corralejo, Diamond Nickel 01/25/2020, 5:27 AM

## 2020-01-25 NOTE — Anesthesia Postprocedure Evaluation (Signed)
Anesthesia Post Note  Patient: Emily Lucas  Procedure(s) Performed: AN AD HOC LABOR EPIDURAL     Patient location during evaluation: Mother Baby Anesthesia Type: Epidural Level of consciousness: awake and alert Pain management: pain level controlled Vital Signs Assessment: post-procedure vital signs reviewed and stable Respiratory status: spontaneous breathing, nonlabored ventilation and respiratory function stable Cardiovascular status: stable Postop Assessment: no headache, no backache and epidural receding Anesthetic complications: no   No complications documented.  Last Vitals:  Vitals:   01/25/20 0415 01/25/20 0540  BP: 114/69 119/66  Pulse: (!) 107 100  Resp: 19 18  Temp: 37.2 C 36.9 C  SpO2: 99% 100%    Last Pain:  Vitals:   01/25/20 0540  TempSrc: Oral  PainSc:    Pain Goal:                   Junious Silk

## 2020-01-25 NOTE — Progress Notes (Signed)
INTERVAL NOTE: S/P NSVD, SD x , uterine atony 660 QBL, chorio  Live born female  Birth Weight: 8 lb 0.2 oz (3634 g) APGAR: 2, 8  Newborn Delivery   Birth date/time: 01/25/2020 01:18:00 Delivery type: Vaginal, Spontaneous     Delivering provider: Rhea Pink B  Episiotomy:None   Lacerations:Vaginal   S:  Sitting in bed, min cramping, (+) voids, small bleed, denies HA/NV/dizziness. Up to BR w/o assist.   Mother of patient asleep at [facility].  Infant in room in [facility], sleeping, stable.  O:   Vitals:   01/25/20 0340 01/25/20 0415 01/25/20 0540 01/25/20 0905  BP:  114/69 119/66 100/61  Pulse:  (!) 107 100 (!) 115  Resp:  19 18 18   Temp: 99.8 F (37.7 C) 98.9 F (37.2 C) 98.5 F (36.9 C)   TempSrc: Oral Oral Oral Oral  SpO2:  99% 100% 100%  Weight:      Height:         AAO x 3, NAD, pale  FF bellow U  Small lochia  A / P:   PPD #0 Principal Problem:   Postpartum care following vaginal delivery 8/13 Active Problems:   Normal labor   Supervision of normal first teen pregnancy   Chorioamnionitis in third trimester, fetus 1   Maternal anemia, with delivery - IDA with superimposed ABL  Mild tachy, afebrile since delivery, leukocytosis  - LR bolus for suspect hypovolemic  - IV feraheme and continue oral Fe and Mag ox  - rpt CBC in AM   - continue Unasyn until 24 hrs afebrile (0100 8/14)   Stable post partum  Routine PP orders  Lactation support for latch  Update to Dr. 9/14, CNM, MSN  01/25/2020 11:23 AM

## 2020-01-25 NOTE — Lactation Note (Signed)
This note was copied from a baby's chart. Lactation Consultation Note  Patient Name: Emily Lucas [id number] Date: 01/25/2020 Reason for consult: Initial assessment;Term;Primapara;1st time breastfeeding  P1 mother whose infant is now 70 hours old.  This is a term baby at 39+6 weeks.    Baby was asleep in the bassinet when I arrived.  Reviewed breast feeding basics with mother including STS, feeding and obtaining a good latch, feeding cues and hand expression.  Mother has been able to express colostrum drops.  Colostrum container provided and milk storage times reviewed.  Finger feeding demonstrated.  Encouraged mother to feed all drops back to baby.  She will feed 8-12 times/24 hours or sooner if baby shows cues.  Suggested mother call for latch assistance, especially since this is her first time breast feeding.  Mother feels like baby is latching and feeding well since delivery.  She has a support person present.    Mom made aware of O/P services, breastfeeding support groups, community resources, and our phone # for post-discharge questions. Mother has a DEBP for home use.     Maternal Data Formula Feeding for Exclusion: No Has patient been taught Hand Expression?: Yes Does the patient have breastfeeding experience prior to this delivery?: No  Feeding Feeding Type: Breast Milk  LATCH Score Latch: Repeated attempts needed to sustain latch, nipple held in mouth throughout feeding, stimulation needed to elicit sucking reflex.  Audible Swallowing: A few with stimulation  Type of Nipple: Everted at rest and after stimulation  Comfort (Breast/Nipple): Soft / non-tender  Hold (Positioning): Assistance needed to correctly position infant at breast and maintain latch.  LATCH Score: 7  Interventions Interventions: Assisted with latch;Breast feeding basics reviewed;Skin to skin;Breast massage;Hand express;Breast compression;Adjust position;Support pillows  Lactation Tools  Discussed/Used     Consult Status Consult Status: Follow-up Date: 01/26/20 Follow-up type: In-patient    Emily Lucas 01/25/2020, 4:26 PM

## 2020-01-25 NOTE — Progress Notes (Signed)
Patient ID: Emily Lucas, female   DOB: 2000-08-08, 19 y.o.   MRN: [id number]   Alerted by distress call, arrived to pt room quickly. Shoulder dystocia in progress.  RN provided suprapubic pressure with Rhea Pink, CNM, delivering.  Moved pt to the end of the bed with nursing staff. I stepped in and applied downward traction, anterior shoulder was release and Dr Salomon Mast hooked left axilla and infant's body delivered. Cord immediately clamped and cut by Dr Salomon Mast and I brought baby immediately to warmer with resuscitation team awaiting.  Care of pt resumed by Rhea Pink, CNM.  Please see her note/delivery summary for further details.    Pt in stable condition when Dr Salomon Mast and I left the room with Neonatology attending the infant.

## 2020-01-26 DIAGNOSIS — D62 Acute posthemorrhagic anemia: Secondary | ICD-10-CM | POA: Diagnosis not present

## 2020-01-26 LAB — PREPARE RBC (CROSSMATCH)

## 2020-01-26 LAB — CBC WITH DIFFERENTIAL/PLATELET
Abs Immature Granulocytes: 0.25 10*3/uL — ABNORMAL HIGH (ref 0.00–0.07)
Basophils Absolute: 0.1 10*3/uL (ref 0.0–0.1)
Basophils Relative: 0 %
Eosinophils Absolute: 0.1 10*3/uL (ref 0.0–0.5)
Eosinophils Relative: 0 %
HCT: 21.2 % — ABNORMAL LOW (ref 36.0–46.0)
Hemoglobin: 6.8 g/dL — CL (ref 12.0–15.0)
Immature Granulocytes: 1 %
Lymphocytes Relative: 12 %
Lymphs Abs: 2.7 10*3/uL (ref 0.7–4.0)
MCH: 28.1 pg (ref 26.0–34.0)
MCHC: 32.1 g/dL (ref 30.0–36.0)
MCV: 87.6 fL (ref 80.0–100.0)
Monocytes Absolute: 1.8 10*3/uL — ABNORMAL HIGH (ref 0.1–1.0)
Monocytes Relative: 8 %
Neutro Abs: 18.3 10*3/uL — ABNORMAL HIGH (ref 1.7–7.7)
Neutrophils Relative %: 79 %
Platelets: 192 10*3/uL (ref 150–400)
RBC: 2.42 MIL/uL — ABNORMAL LOW (ref 3.87–5.11)
RDW: 13.7 % (ref 11.5–15.5)
WBC: 23.3 10*3/uL — ABNORMAL HIGH (ref 4.0–10.5)
nRBC: 0 % (ref 0.0–0.2)

## 2020-01-26 LAB — HEMOGLOBIN AND HEMATOCRIT, BLOOD
HCT: 26.7 % — ABNORMAL LOW (ref 36.0–46.0)
Hemoglobin: 8.6 g/dL — ABNORMAL LOW (ref 12.0–15.0)

## 2020-01-26 MED ORDER — ACETAMINOPHEN 160 MG/5ML PO SOLN
650.0000 mg | Freq: Once | ORAL | Status: AC
  Administered 2020-01-26: 650 mg via ORAL
  Filled 2020-01-26: qty 20.3

## 2020-01-26 MED ORDER — DIPHENHYDRAMINE HCL 12.5 MG/5ML PO ELIX
25.0000 mg | ORAL_SOLUTION | Freq: Once | ORAL | Status: AC
  Administered 2020-01-26: 25 mg via ORAL
  Filled 2020-01-26: qty 10

## 2020-01-26 MED ORDER — DIPHENHYDRAMINE HCL 25 MG PO CAPS
25.0000 mg | ORAL_CAPSULE | Freq: Once | ORAL | Status: DC
  Filled 2020-01-26: qty 1

## 2020-01-26 MED ORDER — ACETAMINOPHEN 325 MG PO TABS
650.0000 mg | ORAL_TABLET | Freq: Once | ORAL | Status: DC
  Filled 2020-01-26: qty 2

## 2020-01-26 MED ORDER — COMPLETENATE 29-1 MG PO CHEW
1.0000 | CHEWABLE_TABLET | Freq: Every day | ORAL | Status: DC
  Administered 2020-01-27: 1 via ORAL
  Filled 2020-01-26 (×2): qty 1

## 2020-01-26 MED ORDER — SODIUM CHLORIDE 0.9% IV SOLUTION: Freq: Once | INTRAVENOUS | Status: AC

## 2020-01-26 NOTE — Progress Notes (Signed)
Subjective: Postpartum Day # 1 : S/P NSVD due to latent labor with SROM, pt was GBS+ tx with x1 dose of penicillin, then switch to Unasyn for 24 hours due to suspected chorioamnionitises, was fgiven pitocin for Augmentin, pt currently 24 hours afebrile, pt had NSVD with 2 min shoulder dystocia at delivery on  8/13, pt then had QBl was with uterine atony and with vaginal and clitoral tear with repair, given cytotex and PP pitocin. Pt had hgb of 10.7 then dropped PP to 7.4, then again 6.8 on PPD#0, given iron infusion on 8/13, today to 6.8 and agreed to blood transfusion 2 unit RBC today, Reviewed R/B/A, pt verbalized consent. Pt endorses fatigue but denies heart palpitation, dizziness, or syncopy. Patient up ad lib, denies syncope or dizziness. Reports consuming regular diet without issues and denies N/V. Patient reports 0 bowel movement + passing flatus.  Denies issues with urination and reports bleeding is "light."  Patient is breast and bottle feeding and reports going well.  Desires nexplanon for postpartum contraception.  Pain is being appropriately managed with use of po meds.   Vaginal and clitoral tears laceration Feeding:  Breast/Bottle Contraceptive plan:  Nexplanon Baby female  Objective: Vital signs in last 24 hours: Patient Vitals for the past 24 hrs:  BP Temp Temp src Pulse Resp SpO2  01/26/20 0528 118/72 97.9 F (36.6 C) Oral (!) 112 18 --  01/26/20 0035 106/71 98.5 F (36.9 C) Oral 91 16 --  01/25/20 2246 (!) 95/49 -- -- 92 16 100 %  01/25/20 1642 98/64 97.8 F (36.6 C) Oral 93 20 --  01/25/20 1303 116/64 98.1 F (36.7 C) Oral (!) 108 20 --  01/25/20 0905 100/61 -- Oral (!) 115 18 100 %     Physical Exam:  General: alert, cooperative, appears stated age and no distress Mood/Affect: Fatigued Lungs: clear to auscultation, no wheezes, rales or rhonchi, symmetric air entry.  Heart: normal rate, regular rhythm, normal S1, S2, no murmurs, rubs, clicks or  gallops. Breast: breasts appear normal, no suspicious masses, no skin or nipple changes or axillary nodes. Abdomen:  + bowel sounds, soft, non-tender GU: perineum approximate, healing well. No signs of external hematomas.  Uterine Fundus: firm Lochia: appropriate Skin: Warm, Dry. DVT Evaluation: No evidence of DVT seen on physical exam. Negative Homan's sign. No cords or calf tenderness. No significant calf/ankle edema.  CBC Latest Ref Rng & Units 01/26/2020 01/25/2020 01/24/2020  WBC 4.0 - 10.5 K/uL 23.3(H) 25.3(H) 13.9(H)  Hemoglobin 12.0 - 15.0 g/dL 6.8(LL) 7.4(L) 10.7(L)  Hematocrit 36 - 46 % 21.2(L) 23.7(L) 33.5(L)  Platelets 150 - 400 K/uL 192 190 206    Results for orders placed or performed during the hospital encounter of 01/24/20 (from the past 24 hour(s))  CBC with Differential/Platelet     Status: Abnormal   Collection Time: 01/26/20  4:10 AM  Result Value Ref Range   WBC 23.3 (H) 4.0 - 10.5 K/uL   RBC 2.42 (L) 3.87 - 5.11 MIL/uL   Hemoglobin 6.8 (LL) 12.0 - 15.0 g/dL   HCT 22.9 (L) 36 - 46 %   MCV 87.6 80.0 - 100.0 fL   MCH 28.1 26.0 - 34.0 pg   MCHC 32.1 30.0 - 36.0 g/dL   RDW 79.8 92.1 - 19.4 %   Platelets 192 150 - 400 K/uL   nRBC 0.0 0.0 - 0.2 %   Neutrophils Relative % 79 %   Neutro Abs 18.3 (H) 1.7 - 7.7 K/uL  Lymphocytes Relative 12 %   Lymphs Abs 2.7 0.7 - 4.0 K/uL   Monocytes Relative 8 %   Monocytes Absolute 1.8 (H) 0 - 1 K/uL   Eosinophils Relative 0 %   Eosinophils Absolute 0.1 0 - 0 K/uL   Basophils Relative 0 %   Basophils Absolute 0.1 0 - 0 K/uL   Immature Granulocytes 1 %   Abs Immature Granulocytes 0.25 (H) 0.00 - 0.07 K/uL     CBG (last 3)  No results for input(s): GLUCAP in the last 72 hours.   I/O last 3 completed shifts: In: 0  Out: 1560 [Urine:900; Blood:660]   Assessment Postpartum Day # 1 : S/P NSVD due to latent labor with SROM, pt was GBS+ tx with x1 dose of penicillin, then switch to Unasyn for 24 hours due to suspected  chorioamnionitises, was fgiven pitocin for Augmentin, pt currently 24 hours afebrile, pt had NSVD with 2 min shoulder dystocia at delivery on  8/13, pt then had QBl was with uterine atony and with vaginal and clitoral tear with repair, given cytotex and PP pitocin. Pt had hgb of 10.7 then dropped PP to 7.4, then again 6.8 on PPD#0, given iron infusion on 8/13, today to 6.8 and agreed to blood transfusion 2 unit RBS today, Reviewed R/B/A, pt verbalized consent. Pt endorses fatigue but denies heart palpitation, dizziness, or syncopy. Pt stable. -1 involution. Breast and bottle feeding. Hemodynamically stable, but hbg 6.8 today. .   Plan: Continue other mgmt as ordered Anemia: 2unit Blood transfusion to start today, will recheck hgb 4 hours post.  VTE prophylactics: Early ambulated as tolerates.  Pain control: Motrin/Tylenol PRN Education given regarding options for contraception, including injectable contraception, Nexplanon.  Plan for discharge tomorrow, Breastfeeding and Lactation consult   Dr. Normand Sloop to be updated on patient status  [facility] NP-C, CNM 01/26/2020, 7:57 AM

## 2020-01-26 NOTE — Lactation Note (Signed)
This note was copied from a Emily's chart. Lactation Consultation Note  Patient Name: Emily Lucas WNUUV'[id number] Date: 01/26/2020 Emily Lucas now 19 hours old with no stool yet. Telephone call from RN.  Mom attempting to breastfeed on arrival.  Emily sleepy at the breast.  Emily does have some rythmic sucking with some intermittent swallows. Infant breastfed off and on for close to 15 minutes. Many attempts to keep her stimulated and awake at the breast.Switched from football hold to cross cradle hold to see if it made any difference. No difference.Infant fell asleep.  Talked to mom  About intititig pumping with DEBP.  Mom in agreement.  Has been trying her own DEBP. Mom has a [employer] Breastpump.  Mom needed to go to the bathroom. LC  Asked mom if we could try a bottle of formula.  Now that infant away from mom she is cuing.  LC attempt to feed infant formula with extra slow flow nipple.  Infant took about 5 ml.  Infant tongue thrusts and moves her tongue all around the bottle nipple.  LC used spoon to give her a few more ml.  Took 2 ml of breastmilk and another 3 ml of formula.  Infant started holding formula in her mouth and not swallowing.  Initiated pumping with DEBP with mom.  Stayed for first 5 minutes of moms pumping session.  Drops of colostrum seen. Left mom pumping and Emily content in gradmas arms.  Urged mom to feed on cue.  Massage, hand express and Post pump past the every 2-3 hour feeding. MD came in to talk with mom.  Let him know infant was not taking a bottle well and maybe a speech consult would help.  Feed back all expressed mothers milk and formula to make up supplement amount.    Maternal Data    Feeding Feeding Type: Breast Fed  LATCH Score Latch: Grasps breast easily, tongue down, lips flanged, rhythmical sucking.  Audible Swallowing: A few with stimulation  Type of Nipple: Everted at rest and after stimulation  Comfort (Breast/Nipple): Soft / non-tender  Hold  (Positioning): Assistance needed to correctly position infant at breast and maintain latch.  LATCH Score: 8  Interventions    Lactation Tools Discussed/Used     Consult Status      Emily Lucas 01/26/2020, 4:43 PM

## 2020-01-27 LAB — TYPE AND SCREEN
ABO/RH(D): O POS
Antibody Screen: NEGATIVE
Unit division: 0
Unit division: 0

## 2020-01-27 LAB — BPAM RBC
Blood Product Expiration Date: 202109122359
Blood Product Expiration Date: 202109152359
ISSUE DATE / TIME: 202108141127
ISSUE DATE / TIME: 202108141444
Unit Type and Rh: 5100
Unit Type and Rh: 5100

## 2020-01-27 MED ORDER — MAGNESIUM OXIDE 400 (241.3 MG) MG PO TABS: 400.0000 mg | ORAL_TABLET | Freq: Every day | ORAL | 1 refills | Status: DC

## 2020-01-27 MED ORDER — COCONUT OIL OIL: 1.0000 "application " | TOPICAL_OIL | 0 refills | Status: DC | PRN

## 2020-01-27 MED ORDER — BENZOCAINE-MENTHOL 20-0.5 % EX AERO: 1.0000 "application " | INHALATION_SPRAY | CUTANEOUS | 1 refills | Status: DC | PRN

## 2020-01-27 MED ORDER — POLYSACCHARIDE IRON COMPLEX 150 MG PO CAPS: 150.0000 mg | ORAL_CAPSULE | Freq: Every day | ORAL | 1 refills | Status: DC

## 2020-01-27 MED ORDER — ACETAMINOPHEN 160 MG/5ML PO SOLN: 1000.0000 mg | Freq: Three times a day (TID) | ORAL | 0 refills | Status: DC | PRN

## 2020-01-27 NOTE — Lactation Note (Signed)
This note was copied from a baby's chart. Lactation Consultation Note  Patient Name: Emily Lucas [id number] Date: 01/27/2020 Reason for consult: Follow-up assessment;Primapara;1st time breastfeeding;Term;Infant weight loss;Other (Comment);Difficult latch (6 % weight loss)   Infant is 39 weeks 57 hours old 6 % weight loss. Per Mom, infant fed at the breast 9:15 am for 5 minutes. Mom noted some soreness with the latch. LC noted no signs of nipple trauma. LC changed the diaper with wet diaper and stool noted, transitional in color. LC assisted Mom with latch in football, infant at breast for 10 minutes some intermittent suck and swallows noted. Infant came off and was sleepy at the breast.   Second LC, latched infant with a tea cup hold but was not sustained.  Plan: 1. Educated Mom to nurse based on feeding cues, use breast shells during the day to help elongate the nipple. Do not use the nipple shells at night.           2. Mom to offer breast first, then pump using her personal DEBP, and will offer either EBM or formula afterwards.            3. Mom to f/u with Pediatrics that has LC on site or f/u with [facility].       LATCH Score: 7  Interventions Interventions: Breast feeding basics reviewed  Lactation Tools Discussed/Used Tools: Shells Shell Type: Other (comment) (compressible breasts) Pump Review: Milk Storage   Consult Status Consult Status: Follow-up (LC recommended checking with Pedis office for [facility] O/P or [place] - has the phone number) Date: 01/27/20 Follow-up type: Out-patient    Crystal  Nicholson-Springer 01/27/2020, 10:55 AM

## 2020-01-27 NOTE — Discharge Summary (Signed)
OB Discharge Summary  Patient Name: Emily Lucas DOB: [birth year] MRN: [id number]  Date of admission: 01/24/2020 Delivering provider: Rhea Pink B   Admitting diagnosis: Normal labor [O80, Z37.9] Intrauterine pregnancy: [redacted]w[redacted]d     Secondary diagnosis: Patient Active Problem List   Diagnosis Date Noted  . Acute blood loss anemia 01/26/2020  . Postpartum care following vaginal delivery 8/13 01/25/2020  . Maternal anemia, with delivery - IDA with superimposed ABL 01/25/2020  . Normal labor 01/24/2020  . Supervision of normal first teen pregnancy 01/24/2020  . Chorioamnionitis in third trimester, fetus 1 01/24/2020  . Abnormal cholangiogram   . S/P laparoscopic cholecystectomy 01/25/2017   Additional problems: Blood transfusion after delivery, 2 units of PRBCs   Date of discharge: 01/27/2020   Discharge diagnosis: Principal Problem:   Postpartum care following vaginal delivery 8/13 Active Problems:   Normal labor   Supervision of normal first teen pregnancy   Chorioamnionitis in third trimester, fetus 1   Maternal anemia, with delivery - IDA with superimposed ABL   Acute blood loss anemia                                                     Post partum procedures:blood transfusion  Augmentation: Pitocin Pain control: Epidural  Laceration:Vaginal  Episiotomy:None  Complications: Intrauterine Inflammation or infection (Chorioamniotis)  Hospital course:  Onset of Labor With Vaginal Delivery      19 y.o. yo G1P1001 at [redacted]w[redacted]d was admitted in Latent Labor on 01/24/2020. Patient had an uncomplicated labor course as follows:  Membrane Rupture Time/Date: 7:30 PM ,01/23/2020   Delivery Method:Vaginal, Spontaneous  Episiotomy: None  Lacerations:  Vaginal  Patient had an postpartum course complicated by chorioamnionitis and acute blood loss. Pt was given 24 hours of IV Unasyn after delivery and has been afebrile for 48 hours. Her hemoglobin 6 hours after delivery was 7.4 and she received  IV iron. Her hemoglobin was repeated at 24 hours postpartum and was 6.8. She was counseled on a blood transfusion and consented. She received 2 units of PRBCs. Her vital signs are all WNL and her hemoglobin is 8.6 post transfusion. She will continue PO iron and mag oxide at discharge. She is ambulating, tolerating a regular diet, passing flatus, and urinating well. Patient is discharged home in stable condition on 01/27/20.  Newborn Data: Birth date:01/25/2020  Birth time:1:18 AM  Gender:Female  Living status:Living  Apgars:2 ,8  JSEGBT:5176 g   Physical exam  Vitals:   01/26/20 1505 01/26/20 1700 01/26/20 2033 01/27/20 0549  BP: 102/60 (!) 106/56 106/66 100/65  Pulse: 72 75 86 72  Resp: 20 20 15 16   Temp: 97.8 F (36.6 C) 97.7 F (36.5 C) 98 F (36.7 C) 97.7 F (36.5 C)  TempSrc: Oral Oral Oral Oral  SpO2: 100% 100% 100% 100%  Weight:      Height:       General: alert, cooperative and no distress Lochia: appropriate Uterine Fundus: firm Perineum: repair intact, no edema DVT Evaluation: No evidence of DVT seen on physical exam. Labs: Lab Results  Component Value Date   WBC 23.3 (H) 01/26/2020   HGB 8.6 (L) 01/26/2020   HCT 26.7 (L) 01/26/2020   MCV 87.6 01/26/2020   PLT 192 01/26/2020   CMP Latest Ref Rng & Units 01/25/2017  Glucose 70 - 99 mg/dL 01/27/2017)  BUN 6 - 23 mg/dL 4(L)  Creatinine 5.10 - 1.20 mg/dL 2.58  Sodium 527 - 782 mEq/L 139  Potassium 3.5 - 5.1 mEq/L 3.8  Chloride 96 - 112 mEq/L 103  CO2 19 - 32 mEq/L 28  Calcium 8.4 - 10.5 mg/dL 9.2  Total Protein 6.0 - 8.3 g/dL 6.7  Total Bilirubin 0.2 - 0.8 mg/dL 0.8  Alkaline Phos 39 - 117 U/L 67  AST 0 - 37 U/L 40(H)  ALT 0 - 35 U/L 50(H)   Edinburgh Postnatal Depression Scale Screening Tool 01/26/2020 01/25/2020  I have been able to laugh and see the funny side of things. 0 (No Data)  I have looked forward with enjoyment to things. 0 -  I have blamed myself unnecessarily when things went wrong. 0 -  I have  been anxious or worried for no good reason. 0 -  I have felt scared or panicky for no good reason. 0 -  Things have been getting on top of me. 0 -  I have been so unhappy that I have had difficulty sleeping. 0 -  I have felt sad or miserable. 0 -  I have been so unhappy that I have been crying. 0 -  The thought of harming myself has occurred to me. 0 -  Edinburgh Postnatal Depression Scale Total 0 -   Vaccines: TDaP UTD         Flu    UTD  Discharge instruction:  per After Visit Summary,   After Visit Meds:  Allergies as of 01/27/2020      Reactions   Motrin [ibuprofen] Hives      Medication List    STOP taking these medications   amoxicillin 500 MG capsule Commonly known as: AMOXIL   ondansetron 4 MG disintegrating tablet Commonly known as: Zofran ODT     TAKE these medications   acetaminophen 160 MG/5ML solution Commonly known as: TYLENOL Take 31.3 mLs (1,000 mg total) by mouth every 8 (eight) hours as needed for fever.   benzocaine-Menthol 20-0.5 % Aero Commonly known as: DERMOPLAST Apply 1 application topically as needed for irritation (perineal discomfort).   coconut oil Oil Apply 1 application topically as needed.   iron polysaccharides 150 MG capsule Commonly known as: Ferrex 150 Take 1 capsule (150 mg total) by mouth daily.   magnesium oxide 400 (241.3 Mg) MG tablet Commonly known as: MAG-OX Take 1 tablet (400 mg total) by mouth daily.   multivitamin-prenatal 27-0.8 MG Tabs tablet Take 1 tablet by mouth daily at 12 noon.            Discharge Care Instructions  (From admission, onward)         Start     Ordered   01/27/20 0000  Discharge wound care:       Comments: Sitz baths 2 times /day with warm water x 1 week. May add herbals: 1 ounce dried comfrey leaf* 1 ounce calendula flowers 1 ounce lavender flowers  Supplies can be found online at [employer] sources at [employer], [employer]  1/2 ounce dried uva ursi leaves 1/2  ounce witch hazel blossoms (if you can find them) 1/2 ounce dried sage leaf 1/2 cup sea salt Directions: Bring 2 quarts of water to a boil. Turn off heat, and place 1 ounce (approximately 1 large handful) of the above mixed herbs (not the salt) into the pot. Steep, covered, for 30 minutes.  Strain the liquid well with a fine mesh strainer,  and discard the herb material. Add 2 quarts of liquid to the tub, along with the 1/2 cup of salt. This medicinal liquid can also be made into compresses and peri-rinses.   01/27/20 0957         Diet: routine diet  Activity: Advance as tolerated. Pelvic rest for 6 weeks.   Postpartum contraception: Not Discussed  Newborn Data: Live born female  Birth Weight: 8 lb 0.2 oz (3634 g) APGAR: 2, 8  Newborn Delivery   Birth date/time: 01/25/2020 01:18:00 Delivery type: Vaginal, Spontaneous     Named Jannette Fogo Baby Feeding: Bottle and Breast Disposition:home with mother  Delivery Report:  Review the Delivery Report for details.    Follow up:  Follow-up Information    [facility]. Schedule an appointment as soon as possible for a visit in 2 week(s).   Specialty: Obstetrics and Gynecology Why: Please make an appointment for 2 weeks postpartum.  Contact information: [address] [place] [address] [phone]             Signed: Clancy Gourd, MSN 01/27/2020, 9:57 AM

## 2020-01-28 LAB — SURGICAL PATHOLOGY

## 2020-01-28 LAB — ABO/RH: ABO/RH(D): O POS

## 2020-04-02 ENCOUNTER — Encounter (HOSPITAL_COMMUNITY): Payer: Self-pay | Admitting: *Deleted

## 2020-04-02 ENCOUNTER — Other Ambulatory Visit: Payer: Self-pay

## 2020-04-02 ENCOUNTER — Ambulatory Visit (HOSPITAL_COMMUNITY)
Admission: EM | Admit: 2020-04-02 | Discharge: 2020-04-02 | Disposition: A | Discharge status: Home / Self Care | Payer: Medicaid Other | Attending: Family Medicine | Admitting: Family Medicine

## 2020-04-02 DIAGNOSIS — H1032 Unspecified acute conjunctivitis, left eye: Secondary | ICD-10-CM

## 2020-04-02 MED ORDER — POLYMYXIN B-TRIMETHOPRIM 10000-0.1 UNIT/ML-% OP SOLN: 1.0000 [drp] | OPHTHALMIC | 0 refills | Status: AC

## 2020-04-02 MED ORDER — TETRACAINE HCL 0.5 % OP SOLN
OPHTHALMIC | Status: AC
  Filled 2020-04-02: qty 4

## 2020-04-02 NOTE — ED Provider Notes (Signed)
[facility]    CSN: [id number] Arrival date & time: 04/02/20  9892      History   Chief Complaint Chief Complaint  Patient presents with  . Eye Pain  . Nasal Congestion  . Eye Redness    HPI Emily Lucas is a 19 y.o. female.   Emily Lucas presents with complaints of left eye redness, irritation and mattering. Noted it last week with onset of some URI/ allergy symptoms. Improved, but then has worsened again over the past two days. No eye pain. No fevers. Mattering this morning. Tearing from the eye. No vision changes. Some sunlight photophobia, no photophobia indoors. No lid swelling. No known exposure to similar or to any chemicals irritants. Doesn't wear contacts.    ROS per HPI, negative if not otherwise mentioned.      Past Medical History:  Diagnosis Date  . Gallstones   . Learning disability    "comprehension"- mother reported    Patient Active Problem List   Diagnosis Date Noted  . Acute blood loss anemia 01/26/2020  . Postpartum care following vaginal delivery 8/13 01/25/2020  . Maternal anemia, with delivery - IDA with superimposed ABL 01/25/2020  . Normal labor 01/24/2020  . Supervision of normal first teen pregnancy 01/24/2020  . Chorioamnionitis in third trimester, fetus 1 01/24/2020  . Abnormal cholangiogram   . S/P laparoscopic cholecystectomy 01/25/2017    Past Surgical History:  Procedure Laterality Date  . cholecystecomy  01/24/2017  . CHOLECYSTECTOMY    . ERCP N/A 02/01/2017   Procedure: ENDOSCOPIC RETROGRADE CHOLANGIOPANCREATOGRAPHY (ERCP);  Surgeon: Rachael Fee, MD;  Location: [facility] ENDOSCOPY;  Service: Endoscopy;  Laterality: N/A;    OB History    Gravida  1   Para  1   Term  1   Preterm  0   AB  0   Living  1     SAB  0   TAB  0   Ectopic  0   Multiple  0   Live Births  1            Home Medications    Prior to Admission medications   Medication Sig Start Date End Date Taking?  Authorizing Provider  coconut oil OIL Apply 1 application topically as needed. 01/27/20  Yes June Leap, CNM  Prenatal Vit-Fe Fumarate-FA (MULTIVITAMIN-PRENATAL) 27-0.8 MG TABS tablet Take 1 tablet by mouth daily at 12 noon.   Yes [provider]  acetaminophen (TYLENOL) 160 MG/5ML solution Take 31.3 mLs (1,000 mg total) by mouth every 8 (eight) hours as needed for fever. 01/27/20   June Leap, CNM  benzocaine-Menthol (DERMOPLAST) 20-0.5 % AERO Apply 1 application topically as needed for irritation (perineal discomfort). 01/27/20   June Leap, CNM  iron polysaccharides (FERREX 150) 150 MG capsule Take 1 capsule (150 mg total) by mouth daily. 01/27/20   June Leap, CNM  magnesium oxide (MAG-OX) 400 (241.3 Mg) MG tablet Take 1 tablet (400 mg total) by mouth daily. 01/27/20   June Leap, CNM  trimethoprim-polymyxin b (POLYTRIM) ophthalmic solution Place 1 drop into the left eye every 4 (four) hours for 5 days. 04/02/20 04/07/20  Georgetta Haber, NP    Family History Family History  Problem Relation Age of Onset  . Cancer Paternal Grandfather     Social History Social History   Tobacco Use  . Smoking status: Never Smoker  . Smokeless tobacco: Never Used  Vaping Use  . Vaping  Use: Never used  Substance Use Topics  . Alcohol use: No    Alcohol/week: 0.0 standard drinks  . Drug use: No     Allergies   Motrin [ibuprofen]   Review of Systems Review of Systems   Physical Exam Triage Vital Signs ED Triage Vitals  Enc Vitals Group     BP 04/02/20 1020 110/70     Pulse Rate 04/02/20 1020 62     Resp 04/02/20 1020 16     Temp 04/02/20 1020 98.5 F (36.9 C)     Temp Source 04/02/20 1020 Oral     SpO2 04/02/20 1020 100 %     Weight --      Height --      Head Circumference --      Peak Flow --      Pain Score 04/02/20 1015 0     Pain Loc --      Pain Edu? --      Excl. in GC? --    No data found.  Updated Vital Signs BP 110/70 (BP Location:  Right Arm)   Pulse 62   Temp 98.5 F (36.9 C) (Oral)   Resp 16   LMP 03/19/2020   SpO2 100%   Visual Acuity Right Eye Distance: 20/25 Left Eye Distance: 20/25 Bilateral Distance: 20/30  Right Eye Near:   Left Eye Near:    Bilateral Near:     Physical Exam Constitutional:      General: She is not in acute distress.    Appearance: She is well-developed.  Eyes:     General: Lids are normal. Lids are everted, no foreign bodies appreciated. Vision grossly intact.        Left eye: No foreign body or hordeolum.     Extraocular Movements: Extraocular movements intact.     Conjunctiva/sclera:     Left eye: Left conjunctiva is injected.     Pupils:     Left eye: No corneal abrasion or fluorescein uptake.  Cardiovascular:     Rate and Rhythm: Normal rate.  Pulmonary:     Effort: Pulmonary effort is normal.  Skin:    General: Skin is warm and dry.  Neurological:     Mental Status: She is alert and oriented to person, place, and time.      UC Treatments / Results  Labs (all labs ordered are listed, but only abnormal results are displayed) Labs Reviewed - No data to display  EKG   Radiology No results found.  Procedures Procedures (including critical care time)  Medications Ordered in UC Medications - No data to display  Initial Impression / Assessment and Plan / UC Course  I have reviewed the triage vital signs and the nursing notes.  Pertinent labs & imaging results that were available during my care of the patient were reviewed by me and considered in my medical decision making (see chart for details).     Exam consistent with conjunctivitis with drops provided. Return precautions provided. Patient verbalized understanding and agreeable to plan.   Final Clinical Impressions(s) / UC Diagnoses   Final diagnoses:  Acute bacterial conjunctivitis of left eye     Discharge Instructions     Avoid touching eye and touching your other eye or touching others as  it can spread.  Complete antibiotics provided.  If symptoms worsen or do not improve in the next week to return to be seen or to follow up with ophthalmology    ED Prescriptions  Medication Sig Dispense Auth. Provider   trimethoprim-polymyxin b (POLYTRIM) ophthalmic solution Place 1 drop into the left eye every 4 (four) hours for 5 days. 10 mL Georgetta Haber, NP     PDMP not reviewed this encounter.   Georgetta Haber, NP 04/02/20 1047

## 2020-04-02 NOTE — Discharge Instructions (Addendum)
Avoid touching eye and touching your other eye or touching others as it can spread.  Complete antibiotics provided.  If symptoms worsen or do not improve in the next week to return to be seen or to follow up with ophthalmology

## 2020-04-02 NOTE — ED Triage Notes (Signed)
Patient in with left eye redness and pain x 1 week. Patient states that she did have a runny nose as well last week. Patient states that eye is sensitive to sunlight. Patient states that today when she woke up eye was crusted and hard to open.

## 2020-04-30 ENCOUNTER — Ambulatory Visit (HOSPITAL_COMMUNITY)
Admission: EM | Admit: 2020-04-30 | Discharge: 2020-04-30 | Disposition: A | Discharge status: Home / Self Care | Payer: Medicaid Other | Attending: Emergency Medicine | Admitting: Emergency Medicine

## 2020-04-30 ENCOUNTER — Other Ambulatory Visit: Payer: Self-pay

## 2020-04-30 ENCOUNTER — Telehealth (HOSPITAL_COMMUNITY): Payer: Self-pay | Admitting: Emergency Medicine

## 2020-04-30 ENCOUNTER — Encounter (HOSPITAL_COMMUNITY): Payer: Self-pay | Admitting: *Deleted

## 2020-04-30 DIAGNOSIS — H1032 Unspecified acute conjunctivitis, left eye: Secondary | ICD-10-CM

## 2020-04-30 DIAGNOSIS — J019 Acute sinusitis, unspecified: Secondary | ICD-10-CM | POA: Diagnosis not present

## 2020-04-30 MED ORDER — AMOXICILLIN-POT CLAVULANATE 875-125 MG PO TABS
1.0000 | ORAL_TABLET | Freq: Two times a day (BID) | ORAL | 0 refills | Status: DC
Start: 2020-04-30 — End: 2020-04-30

## 2020-04-30 MED ORDER — POLYMYXIN B-TRIMETHOPRIM 10000-0.1 UNIT/ML-% OP SOLN: 1.0000 [drp] | Freq: Four times a day (QID) | OPHTHALMIC | 0 refills | Status: DC

## 2020-04-30 MED ORDER — AMOXICILLIN-POT CLAVULANATE 400-57 MG/5ML PO SUSR
800.0000 mg | Freq: Two times a day (BID) | ORAL | 0 refills | Status: AC
Start: 2020-04-30 — End: 2020-05-07

## 2020-04-30 NOTE — Discharge Instructions (Signed)
Begin Augmentin twice daily for 1 week  Conjunctivitis- - Have Good Hand Hygiene  - Use Cold Compresses  - Use Polytrim eye drops (2 drops 4 times a day for 5-7 days)

## 2020-04-30 NOTE — Telephone Encounter (Signed)
Spoke with pt to advise liquid augmentin has been sent to pharmacy.

## 2020-04-30 NOTE — ED Triage Notes (Signed)
Pt reports 2 days ago she developed redness to Lt eye with drainage from eye.

## 2020-04-30 NOTE — Telephone Encounter (Signed)
Resending liquid Augmentin as alternative to pills

## 2020-04-30 NOTE — ED Provider Notes (Signed)
[facility]    CSN: [id number] Arrival date & time: 04/30/20  1041      History   Chief Complaint Chief Complaint  Patient presents with  . Conjunctivitis    LT    HPI Emily Lucas is a 19 y.o. female presenting today for evaluation of left eye redness and drainage.  Patient reports over the past 2 days she has developed increased redness and drainage from her left eye.  Similar to what she felt approximately 1 month ago when she was seen here and treated for conjunctivitis with Polytrim.  Symptoms did resolve.  She also reports over the past month she has also had associated URI symptoms of nasal congestion and sore throat.  HPI  Past Medical History:  Diagnosis Date  . Gallstones   . Learning disability    "comprehension"- mother reported    Patient Active Problem List   Diagnosis Date Noted  . Acute blood loss anemia 01/26/2020  . Postpartum care following vaginal delivery 8/13 01/25/2020  . Maternal anemia, with delivery - IDA with superimposed ABL 01/25/2020  . Normal labor 01/24/2020  . Supervision of normal first teen pregnancy 01/24/2020  . Chorioamnionitis in third trimester, fetus 1 01/24/2020  . Abnormal cholangiogram   . S/P laparoscopic cholecystectomy 01/25/2017    Past Surgical History:  Procedure Laterality Date  . cholecystecomy  01/24/2017  . CHOLECYSTECTOMY    . ERCP N/A 02/01/2017   Procedure: ENDOSCOPIC RETROGRADE CHOLANGIOPANCREATOGRAPHY (ERCP);  Surgeon: Rachael Fee, MD;  Location: [facility] ENDOSCOPY;  Service: Endoscopy;  Laterality: N/A;    OB History    Gravida  1   Para  1   Term  1   Preterm  0   AB  0   Living  1     SAB  0   TAB  0   Ectopic  0   Multiple  0   Live Births  1            Home Medications    Prior to Admission medications   Medication Sig Start Date End Date Taking? Authorizing Provider  Prenatal Vit-Fe Fumarate-FA (MULTIVITAMIN-PRENATAL) 27-0.8 MG TABS tablet Take 1 tablet by  mouth daily at 12 noon.   Yes [provider]  amoxicillin-clavulanate (AUGMENTIN) 875-125 MG tablet Take 1 tablet by mouth every 12 (twelve) hours for 7 days. 04/30/20 05/07/20  Annalie Wenner C, PA-C  trimethoprim-polymyxin b (POLYTRIM) ophthalmic solution Place 1-2 drops into the left eye every 6 (six) hours. 04/30/20   Iara Monds C, PA-C  iron polysaccharides (FERREX 150) 150 MG capsule Take 1 capsule (150 mg total) by mouth daily. 01/27/20 04/30/20  June Leap, CNM    Family History Family History  Problem Relation Age of Onset  . Cancer Paternal Grandfather     Social History Social History   Tobacco Use  . Smoking status: Never Smoker  . Smokeless tobacco: Never Used  Vaping Use  . Vaping Use: Never used  Substance Use Topics  . Alcohol use: No    Alcohol/week: 0.0 standard drinks  . Drug use: No     Allergies   Motrin [ibuprofen]   Review of Systems Review of Systems  Constitutional: Negative for activity change, appetite change, chills, fatigue and fever.  HENT: Negative for congestion, ear pain, rhinorrhea, sinus pressure, sore throat and trouble swallowing.   Eyes: Positive for photophobia, discharge, redness and itching. Negative for pain and visual disturbance.  Respiratory: Negative for cough,  chest tightness and shortness of breath.   Cardiovascular: Negative for chest pain.  Gastrointestinal: Negative for abdominal pain, diarrhea, nausea and vomiting.  Musculoskeletal: Negative for myalgias.  Skin: Negative for rash.  Neurological: Negative for dizziness, light-headedness and headaches.     Physical Exam Triage Vital Signs ED Triage Vitals  Enc Vitals Group     BP 04/30/20 1214 (!) 110/59     Pulse Rate 04/30/20 1214 65     Resp 04/30/20 1214 16     Temp 04/30/20 1214 98 F (36.7 C)     Temp Source 04/30/20 1214 Oral     SpO2 04/30/20 1214 100 %     Weight --      Height 04/30/20 1217 5\' 4"  (1.626 m)     Head Circumference  --      Peak Flow --      Pain Score --      Pain Loc --      Pain Edu? --      Excl. in GC? --    No data found.  Updated Vital Signs BP (!) 110/59 (BP Location: Left Arm)   Pulse 65   Temp 98 F (36.7 C) (Oral)   Resp 16   Ht 5\' 4"  (1.626 m)   LMP 03/14/2020   SpO2 100%   BMI 28.67 kg/m   Visual Acuity Right Eye Distance:   Left Eye Distance:   Bilateral Distance:    Right Eye Near:   Left Eye Near:    Bilateral Near:     Physical Exam Vitals and nursing note reviewed.  Constitutional:      Appearance: She is well-developed.     Comments: No acute distress  HENT:     Head: Normocephalic and atraumatic.     Ears:     Comments: Bilateral ears without tenderness to palpation of external auricle, tragus and mastoid, EAC's without erythema or swelling, TM's with good bony landmarks and cone of light. Non erythematous.     Nose: Nose normal.     Mouth/Throat:     Comments: Bilateral tonsils moderately enlarged, left tonsil with exudate present Eyes:     General:        Left eye: Discharge present.    Extraocular Movements: Extraocular movements intact.     Pupils: Pupils are equal, round, and reactive to light.     Comments: Right eye conjunctiva injected, anterior chamber clear  Cardiovascular:     Rate and Rhythm: Normal rate.  Pulmonary:     Effort: Pulmonary effort is normal. No respiratory distress.     Comments: Breathing comfortably at rest, CTABL, no wheezing, rales or other adventitious sounds auscultated Abdominal:     General: There is no distension.  Musculoskeletal:        General: Normal range of motion.     Cervical back: Neck supple.  Skin:    General: Skin is warm and dry.  Neurological:     Mental Status: She is alert and oriented to person, place, and time.      UC Treatments / Results  Labs (all labs ordered are listed, but only abnormal results are displayed) Labs Reviewed - No data to display  EKG   Radiology No results  found.  Procedures Procedures (including critical care time)  Medications Ordered in UC Medications - No data to display  Initial Impression / Assessment and Plan / UC Course  I have reviewed the triage vital signs and the nursing notes.  Pertinent labs & imaging results that were available during my care of the patient were reviewed by me and considered in my medical decision making (see chart for details).     Treating for bacterial conjunctivitis/sinusitis with Augmentin and topical Polytrim.  Denies contact use.  No red flags for ocular emergency.  No trauma.  Continue to monitor,Discussed strict return precautions. Patient verbalized understanding and is agreeable with plan.   Final Clinical Impressions(s) / UC Diagnoses   Final diagnoses:  Acute bacterial conjunctivitis of left eye  Acute sinusitis with symptoms > 10 days     Discharge Instructions     Begin Augmentin twice daily for 1 week  Conjunctivitis- - Have Good Hand Hygiene  - Use Cold Compresses  - Use Polytrim eye drops (2 drops 4 times a day for 5-7 days)        ED Prescriptions    Medication Sig Dispense Auth. Provider   amoxicillin-clavulanate (AUGMENTIN) 875-125 MG tablet Take 1 tablet by mouth every 12 (twelve) hours for 7 days. 14 tablet Clanton Emanuelson C, PA-C   trimethoprim-polymyxin b (POLYTRIM) ophthalmic solution Place 1-2 drops into the left eye every 6 (six) hours. 10 mL Asalee Barrette, Charlton C, PA-C     PDMP not reviewed this encounter.   Lew Dawes, PA-C 04/30/20 1301

## 2020-05-09 ENCOUNTER — Ambulatory Visit (HOSPITAL_COMMUNITY)
Admission: EM | Admit: 2020-05-09 | Discharge: 2020-05-09 | Disposition: A | Discharge status: Home / Self Care | Payer: Medicaid Other | Attending: Physician Assistant | Admitting: Physician Assistant

## 2020-05-09 ENCOUNTER — Other Ambulatory Visit: Payer: Self-pay

## 2020-05-09 ENCOUNTER — Encounter (HOSPITAL_COMMUNITY): Payer: Self-pay

## 2020-05-09 DIAGNOSIS — L0100 Impetigo, unspecified: Secondary | ICD-10-CM | POA: Diagnosis not present

## 2020-05-09 MED ORDER — MUPIROCIN CALCIUM 2 % EX CREA
1.0000 | TOPICAL_CREAM | Freq: Two times a day (BID) | CUTANEOUS | 0 refills | Status: DC
Start: 2020-05-09 — End: 2020-09-09

## 2020-05-09 NOTE — ED Provider Notes (Signed)
[facility]    CSN: [id number] Arrival date & time: 05/09/20  1320      History   Chief Complaint Chief Complaint  Patient presents with  . Rash    HPI Emily Lucas is a 19 y.o. female.   Pt complains of a crusting rash to her face that started about one week ago. Denies pain, itchiness.  She denies new face wash, creams, or makeup. She has applied vaseline with no improvement.      Past Medical History:  Diagnosis Date  . Gallstones   . Learning disability    "comprehension"- mother reported    Patient Active Problem List   Diagnosis Date Noted  . Acute blood loss anemia 01/26/2020  . Postpartum care following vaginal delivery 8/13 01/25/2020  . Maternal anemia, with delivery - IDA with superimposed ABL 01/25/2020  . Normal labor 01/24/2020  . Supervision of normal first teen pregnancy 01/24/2020  . Chorioamnionitis in third trimester, fetus 1 01/24/2020  . Abnormal cholangiogram   . S/P laparoscopic cholecystectomy 01/25/2017    Past Surgical History:  Procedure Laterality Date  . cholecystecomy  01/24/2017  . CHOLECYSTECTOMY    . ERCP N/A 02/01/2017   Procedure: ENDOSCOPIC RETROGRADE CHOLANGIOPANCREATOGRAPHY (ERCP);  Surgeon: Rachael Fee, MD;  Location: [facility] ENDOSCOPY;  Service: Endoscopy;  Laterality: N/A;    OB History    Gravida  1   Para  1   Term  1   Preterm  0   AB  0   Living  1     SAB  0   TAB  0   Ectopic  0   Multiple  0   Live Births  1            Home Medications    Prior to Admission medications   Medication Sig Start Date End Date Taking? Authorizing Provider  Prenatal Vit-Fe Fumarate-FA (MULTIVITAMIN-PRENATAL) 27-0.8 MG TABS tablet Take 1 tablet by mouth daily at 12 noon.    [provider]  trimethoprim-polymyxin b (POLYTRIM) ophthalmic solution Place 1-2 drops into the left eye every 6 (six) hours. 04/30/20   Wieters, Hallie C, PA-C  iron polysaccharides (FERREX 150) 150 MG capsule  Take 1 capsule (150 mg total) by mouth daily. 01/27/20 04/30/20  June Leap, CNM    Family History Family History  Problem Relation Age of Onset  . Cancer Paternal Grandfather     Social History Social History   Tobacco Use  . Smoking status: Never Smoker  . Smokeless tobacco: Never Used  Vaping Use  . Vaping Use: Never used  Substance Use Topics  . Alcohol use: No    Alcohol/week: 0.0 standard drinks  . Drug use: No     Allergies   Motrin [ibuprofen]   Review of Systems Review of Systems  Constitutional: Negative for chills and fever.  HENT: Negative for ear pain and sore throat.   Eyes: Negative for pain and visual disturbance.  Respiratory: Negative for cough and shortness of breath.   Cardiovascular: Negative for chest pain and palpitations.  Gastrointestinal: Negative for abdominal pain and vomiting.  Genitourinary: Negative for dysuria and hematuria.  Musculoskeletal: Negative for arthralgias and back pain.  Skin: Positive for rash (perioral rash). Negative for color change.  Neurological: Negative for seizures and syncope.  All other systems reviewed and are negative.    Physical Exam Triage Vital Signs ED Triage Vitals  Enc Vitals Group     BP 05/09/20 1512  107/61     Pulse Rate 05/09/20 1337 62     Resp 05/09/20 1337 16     Temp 05/09/20 1512 98.8 F (37.1 C)     Temp Source 05/09/20 1512 Oral     SpO2 05/09/20 1337 100 %     Weight --      Height --      Head Circumference --      Peak Flow --      Pain Score 05/09/20 1510 0     Pain Loc --      Pain Edu? --      Excl. in GC? --    No data found.  Updated Vital Signs BP 107/61 (BP Location: Right Arm)   Pulse 71   Temp 98.8 F (37.1 C) (Oral)   Resp 18   LMP 03/15/2020   SpO2 100%   Visual Acuity Right Eye Distance:   Left Eye Distance:   Bilateral Distance:    Right Eye Near:   Left Eye Near:    Bilateral Near:     Physical Exam Vitals and nursing note reviewed.    Constitutional:      General: She is not in acute distress.    Appearance: She is well-developed.  HENT:     Head: Normocephalic and atraumatic.  Eyes:     Conjunctiva/sclera: Conjunctivae normal.  Cardiovascular:     Rate and Rhythm: Normal rate and regular rhythm.     Heart sounds: No murmur heard.   Pulmonary:     Effort: Pulmonary effort is normal. No respiratory distress.     Breath sounds: Normal breath sounds.  Abdominal:     Palpations: Abdomen is soft.     Tenderness: There is no abdominal tenderness.  Musculoskeletal:     Cervical back: Neck supple.  Skin:    General: Skin is warm and dry.     Comments: Erythematous pustules with some yellow crusting around upper lip.   Neurological:     Mental Status: She is alert.      UC Treatments / Results  Labs (all labs ordered are listed, but only abnormal results are displayed) Labs Reviewed - No data to display  EKG   Radiology No results found.  Procedures Procedures (including critical care time)  Medications Ordered in UC Medications - No data to display  Initial Impression / Assessment and Plan / UC Course  I have reviewed the triage vital signs and the nursing notes.  Pertinent labs & imaging results that were available during my care of the patient were reviewed by me and considered in my medical decision making (see chart for details).     Impetigo, mupirocin prescribed. Application instructions given. Return precautions given.  Final Clinical Impressions(s) / UC Diagnoses   Final diagnoses:  None   Discharge Instructions   None    ED Prescriptions    None     PDMP not reviewed this encounter.   Jodell Cipro, PA-C 05/09/20 1545

## 2020-05-09 NOTE — ED Notes (Signed)
Pt denies difficulty breathing, throat tightness, lip swelling

## 2020-05-09 NOTE — ED Triage Notes (Signed)
Pt presents with rash on face X 1 week with no complaints of itchiness or pain.

## 2020-05-09 NOTE — Discharge Instructions (Signed)
Apply topical cream as directed Keep area clean and dry

## 2020-09-09 ENCOUNTER — Ambulatory Visit (HOSPITAL_COMMUNITY)
Admission: EM | Admit: 2020-09-09 | Discharge: 2020-09-09 | Disposition: A | Discharge status: Home / Self Care | Payer: Medicaid Other | Attending: Emergency Medicine | Admitting: Emergency Medicine

## 2020-09-09 ENCOUNTER — Encounter (HOSPITAL_COMMUNITY): Payer: Self-pay | Admitting: *Deleted

## 2020-09-09 ENCOUNTER — Other Ambulatory Visit: Payer: Self-pay

## 2020-09-09 DIAGNOSIS — H9202 Otalgia, left ear: Secondary | ICD-10-CM | POA: Diagnosis not present

## 2020-09-09 DIAGNOSIS — S01332A Puncture wound without foreign body of left ear, initial encounter: Secondary | ICD-10-CM | POA: Diagnosis not present

## 2020-09-09 MED ORDER — MUPIROCIN CALCIUM 2 % EX CREA
1.0000 | TOPICAL_CREAM | Freq: Two times a day (BID) | CUTANEOUS | 0 refills | Status: DC
Start: 2020-09-09 — End: 2021-05-08

## 2020-09-09 NOTE — ED Triage Notes (Signed)
Pt reports infection to back od Lt ear from fake earrings

## 2020-09-09 NOTE — Discharge Instructions (Addendum)
Use a piercing aftercare spray to really clean your ear, front and back.  Clean your ear several times a day.  Put the Bactroban on your ear twice a day.   Try to avoid earrings that contain Nickel (look for "nickel free" jewelry).   If your ear is not improving in the next 3-5 days, follow up with [place].

## 2020-09-09 NOTE — ED Provider Notes (Signed)
[facility]    CSN: [id number] Arrival date & time: 09/09/20  1058      History   Chief Complaint Chief Complaint  Patient presents with  . skin infection    HPI Emily Lucas is a 20 y.o. female.   Emily Lucas is a 20 y.o. here with chief complaint of left ear pain and infection from earrings.  Reports wore a pair of fake earrings and then developed pain and swelling to the piercing on her left ear.  Reports minimal drainage.  Denies any specific alleviating or aggravating factors.  Denies any fevers, chest pain, shortness of breath, N/V/D, numbness, tingling, weakness, abdominal pain, or headaches.   ROS: As per HPI, all other pertinent ROS negative      The history is provided by the patient.    Past Medical History:  Diagnosis Date  . Gallstones   . Learning disability    "comprehension"- mother reported    Patient Active Problem List   Diagnosis Date Noted  . Acute blood loss anemia 01/26/2020  . Postpartum care following vaginal delivery 8/13 01/25/2020  . Maternal anemia, with delivery - IDA with superimposed ABL 01/25/2020  . Normal labor 01/24/2020  . Supervision of normal first teen pregnancy 01/24/2020  . Chorioamnionitis in third trimester, fetus 1 01/24/2020  . Abnormal cholangiogram   . S/P laparoscopic cholecystectomy 01/25/2017    Past Surgical History:  Procedure Laterality Date  . cholecystecomy  01/24/2017  . CHOLECYSTECTOMY    . ERCP N/A 02/01/2017   Procedure: ENDOSCOPIC RETROGRADE CHOLANGIOPANCREATOGRAPHY (ERCP);  Surgeon: Rachael Fee, MD;  Location: [facility] ENDOSCOPY;  Service: Endoscopy;  Laterality: N/A;    OB History    Gravida  1   Para  1   Term  1   Preterm  0   AB  0   Living  1     SAB  0   IAB  0   Ectopic  0   Multiple  0   Live Births  1            Home Medications    Prior to Admission medications   Medication Sig Start Date End Date Taking? Authorizing Provider  mupirocin cream  (BACTROBAN) 2 % Apply 1 application topically 2 (two) times daily. 09/09/20  Yes Ivette Loyal, NP  Prenatal Vit-Fe Fumarate-FA (MULTIVITAMIN-PRENATAL) 27-0.8 MG TABS tablet Take 1 tablet by mouth daily at 12 noon.    [provider]  trimethoprim-polymyxin b (POLYTRIM) ophthalmic solution Place 1-2 drops into the left eye every 6 (six) hours. 04/30/20   Wieters, Hallie C, PA-C  iron polysaccharides (FERREX 150) 150 MG capsule Take 1 capsule (150 mg total) by mouth daily. 01/27/20 04/30/20  June Leap, CNM    Family History Family History  Problem Relation Age of Onset  . Cancer Paternal Grandfather     Social History Social History   Tobacco Use  . Smoking status: Never Smoker  . Smokeless tobacco: Never Used  Vaping Use  . Vaping Use: Never used  Substance Use Topics  . Alcohol use: No    Alcohol/week: 0.0 standard drinks  . Drug use: No     Allergies   Motrin [ibuprofen]   Review of Systems Review of Systems  HENT: Positive for ear pain.      Physical Exam Triage Vital Signs ED Triage Vitals  Enc Vitals Group     BP 09/09/20 1129 98/61     Pulse  Rate 09/09/20 1129 80     Resp 09/09/20 1129 16     Temp 09/09/20 1129 98.3 F (36.8 C)     Temp Source 09/09/20 1129 Oral     SpO2 09/09/20 1129 100 %     Weight --      Height --      Head Circumference --      Peak Flow --      Pain Score 09/09/20 1131 1     Pain Loc --      Pain Edu? --      Excl. in GC? --    No data found.  Updated Vital Signs BP 98/61 (BP Location: Right Arm)   Pulse 80   Temp 98.3 F (36.8 C) (Oral)   Resp 16   LMP 09/03/2020   SpO2 100%   Visual Acuity Right Eye Distance:   Left Eye Distance:   Bilateral Distance:    Right Eye Near:   Left Eye Near:    Bilateral Near:     Physical Exam Vitals and nursing note reviewed.  Constitutional:      General: She is not in acute distress.    Appearance: Normal appearance. She is not ill-appearing, toxic-appearing  or diaphoretic.  HENT:     Head: Normocephalic and atraumatic.     Right Ear: External ear normal.     Ears:      Comments: Mild erythema and minimal purulent discharge from second piercing on left ear Eyes:     Conjunctiva/sclera: Conjunctivae normal.  Cardiovascular:     Rate and Rhythm: Normal rate.     Pulses: Normal pulses.  Pulmonary:     Effort: Pulmonary effort is normal.  Abdominal:     General: Abdomen is flat.  Musculoskeletal:        General: Normal range of motion.     Cervical back: Normal range of motion.  Skin:    General: Skin is warm and dry.  Neurological:     General: No focal deficit present.     Mental Status: She is alert and oriented to person, place, and time.  Psychiatric:        Mood and Affect: Mood normal.      UC Treatments / Results  Labs (all labs ordered are listed, but only abnormal results are displayed) Labs Reviewed - No data to display  EKG   Radiology No results found.  Procedures Procedures (including critical care time)  Medications Ordered in UC Medications - No data to display  Initial Impression / Assessment and Plan / UC Course  I have reviewed the triage vital signs and the nursing notes.  Pertinent labs & imaging results that were available during my care of the patient were reviewed by me and considered in my medical decision making (see chart for details).     Complication of left ear piercing Left ear pain Clean ear lobe front and back with piercing aftercare spray.  Apply mupirocin ointment twice a day.  Avoid jewelry that contains nickel.   Follow up if your symptoms do not improve in the next 3-5 days.   Final Clinical Impressions(s) / UC Diagnoses   Final diagnoses:  Complication of left ear piercing, initial encounter  Left ear pain     Discharge Instructions     Use a piercing aftercare spray to really clean your ear, front and back.  Clean your ear several times a day.  Put the Bactroban  on your ear  twice a day.   Try to avoid earrings that contain Nickel (look for "nickel free" jewelry).   If your ear is not improving in the next 3-5 days, follow up with [place].       ED Prescriptions    Medication Sig Dispense Auth. Provider   mupirocin cream (BACTROBAN) 2 % Apply 1 application topically 2 (two) times daily. 15 g Ivette Loyal, NP     PDMP not reviewed this encounter.   Ivette Loyal, NP 09/09/20 1209

## 2020-11-14 ENCOUNTER — Telehealth (HOSPITAL_COMMUNITY): Payer: Self-pay

## 2020-11-14 ENCOUNTER — Ambulatory Visit (HOSPITAL_COMMUNITY)
Admission: EM | Admit: 2020-11-14 | Discharge: 2020-11-14 | Disposition: A | Discharge status: Home / Self Care | Payer: Medicaid Other | Attending: Family Medicine | Admitting: Family Medicine

## 2020-11-14 ENCOUNTER — Other Ambulatory Visit: Payer: Self-pay

## 2020-11-14 ENCOUNTER — Encounter (HOSPITAL_COMMUNITY): Payer: Self-pay | Admitting: Emergency Medicine

## 2020-11-14 DIAGNOSIS — J029 Acute pharyngitis, unspecified: Secondary | ICD-10-CM

## 2020-11-14 LAB — POCT RAPID STREP A, ED / UC: Streptococcus, Group A Screen (Direct): NEGATIVE

## 2020-11-14 MED ORDER — DEXAMETHASONE SODIUM PHOSPHATE 10 MG/ML IJ SOLN
10.0000 mg | Freq: Once | INTRAMUSCULAR | Status: AC
  Administered 2020-11-14: 10 mg via INTRAMUSCULAR

## 2020-11-14 MED ORDER — AMOXICILLIN 875 MG PO TABS
875.0000 mg | ORAL_TABLET | Freq: Two times a day (BID) | ORAL | 0 refills | Status: DC
Start: 2020-11-14 — End: 2020-11-14

## 2020-11-14 MED ORDER — LIDOCAINE VISCOUS HCL 2 % MT SOLN
10.0000 mL | OROMUCOSAL | 0 refills | Status: DC | PRN
Start: 2020-11-14 — End: 2021-05-08

## 2020-11-14 MED ORDER — AMOXICILLIN 875 MG PO TABS
875.0000 mg | ORAL_TABLET | Freq: Two times a day (BID) | ORAL | 0 refills | Status: DC
Start: 2020-11-14 — End: 2020-11-18

## 2020-11-14 MED ORDER — DEXAMETHASONE SODIUM PHOSPHATE 10 MG/ML IJ SOLN
INTRAMUSCULAR | Status: AC
  Filled 2020-11-14: qty 1

## 2020-11-14 NOTE — Telephone Encounter (Signed)
Called pt back, pt states pharmacy reports being out of stock of amoxicillin that was ordered. Roosvelt Maser PA notified and looking into this issue.

## 2020-11-14 NOTE — ED Triage Notes (Signed)
Pt presents with sore throat with white spots xs 3 days.

## 2020-11-14 NOTE — ED Provider Notes (Signed)
[facility]    CSN: [id number] Arrival date & time: 11/14/20  1215      History   Chief Complaint Chief Complaint  Patient presents with  . Sore Throat    HPI Emily Lucas is a 20 y.o. female.   Presenting today with 3-day history of sore, swollen throat, trouble swallowing, white pockets on tonsils.  She denies fever, chills, abdominal pain, nausea vomiting diarrhea, congestion, cough.  So far not trying anything over-the-counter for symptoms.  No recent sick contacts with similar symptoms.  States [facility] symptoms, swab for strep but states they told her they would not have results for the next 2 to 3 days.     Past Medical History:  Diagnosis Date  . Gallstones   . Learning disability    "comprehension"- mother reported    Patient Active Problem List   Diagnosis Date Noted  . Acute blood loss anemia 01/26/2020  . Postpartum care following vaginal delivery 8/13 01/25/2020  . Maternal anemia, with delivery - IDA with superimposed ABL 01/25/2020  . Normal labor 01/24/2020  . Supervision of normal first teen pregnancy 01/24/2020  . Chorioamnionitis in third trimester, fetus 1 01/24/2020  . Abnormal cholangiogram   . S/P laparoscopic cholecystectomy 01/25/2017    Past Surgical History:  Procedure Laterality Date  . cholecystecomy  01/24/2017  . CHOLECYSTECTOMY    . ERCP N/A 02/01/2017   Procedure: ENDOSCOPIC RETROGRADE CHOLANGIOPANCREATOGRAPHY (ERCP);  Surgeon: Rachael Fee, MD;  Location: [facility] ENDOSCOPY;  Service: Endoscopy;  Laterality: N/A;    OB History    Gravida  1   Para  1   Term  1   Preterm  0   AB  0   Living  1     SAB  0   IAB  0   Ectopic  0   Multiple  0   Live Births  1            Home Medications    Prior to Admission medications   Medication Sig Start Date End Date Taking? Authorizing Provider  amoxicillin (AMOXIL) 875 MG tablet Take 1 tablet (875 mg  total) by mouth 2 (two) times daily. 11/14/20  Yes Particia Nearing, PA-C  lidocaine (XYLOCAINE) 2 % solution Use as directed 10 mLs in the mouth or throat as needed for mouth pain. 11/14/20  Yes Particia Nearing, PA-C  mupirocin cream (BACTROBAN) 2 % Apply 1 application topically 2 (two) times daily. 09/09/20   Ivette Loyal, NP  Prenatal Vit-Fe Fumarate-FA (MULTIVITAMIN-PRENATAL) 27-0.8 MG TABS tablet Take 1 tablet by mouth daily at 12 noon.    [provider]  trimethoprim-polymyxin b (POLYTRIM) ophthalmic solution Place 1-2 drops into the left eye every 6 (six) hours. 04/30/20   Wieters, Hallie C, PA-C  iron polysaccharides (FERREX 150) 150 MG capsule Take 1 capsule (150 mg total) by mouth daily. 01/27/20 04/30/20  June Leap, CNM    Family History Family History  Problem Relation Age of Onset  . Cancer Paternal Grandfather     Social History Social History   Tobacco Use  . Smoking status: Never Smoker  . Smokeless tobacco: Never Used  Vaping Use  . Vaping Use: Never used  Substance Use Topics  . Alcohol use: No    Alcohol/week: 0.0 standard drinks  . Drug use: No     Allergies   Motrin [ibuprofen]   Review of Systems  Review of Systems Per HPI  Physical Exam Triage Vital Signs ED Triage Vitals  Enc Vitals Group     BP 11/14/20 1309 110/62     Pulse Rate 11/14/20 1309 94     Resp 11/14/20 1309 16     Temp 11/14/20 1309 98.9 F (37.2 C)     Temp Source 11/14/20 1309 Oral     SpO2 11/14/20 1309 100 %     Weight --      Height --      Head Circumference --      Peak Flow --      Pain Score 11/14/20 1307 4     Pain Loc --      Pain Edu? --      Excl. in GC? --    No data found.  Updated Vital Signs BP 110/62 (BP Location: Right Arm)   Pulse 94   Temp 98.9 F (37.2 C) (Oral)   Resp 16   LMP 10/25/2020   SpO2 100%   Visual Acuity Right Eye Distance:   Left Eye Distance:   Bilateral Distance:    Right Eye Near:   Left Eye  Near:    Bilateral Near:     Physical Exam Vitals and nursing note reviewed.  Constitutional:      Appearance: Normal appearance. She is not ill-appearing.  HENT:     Head: Atraumatic.     Mouth/Throat:     Mouth: Mucous membranes are moist.     Pharynx: Oropharyngeal exudate and posterior oropharyngeal erythema present.     Comments: Bilateral tonsillar erythema, edema, significant exudates on left tonsil Eyes:     Extraocular Movements: Extraocular movements intact.     Conjunctiva/sclera: Conjunctivae normal.  Cardiovascular:     Rate and Rhythm: Normal rate and regular rhythm.     Heart sounds: Normal heart sounds.  Pulmonary:     Effort: Pulmonary effort is normal. No respiratory distress.     Breath sounds: Normal breath sounds. No wheezing or rales.  Abdominal:     General: Bowel sounds are normal. There is no distension.     Palpations: Abdomen is soft.     Tenderness: There is no abdominal tenderness. There is no guarding.  Musculoskeletal:        General: Normal range of motion.     Cervical back: Normal range of motion and neck supple.  Lymphadenopathy:     Cervical: Cervical adenopathy present.  Skin:    General: Skin is warm and dry.  Neurological:     Mental Status: She is alert and oriented to person, place, and time.  Psychiatric:        Mood and Affect: Mood normal.        Thought Content: Thought content normal.        Judgment: Judgment normal.      UC Treatments / Results  Labs (all labs ordered are listed, but only abnormal results are displayed) Labs Reviewed  CULTURE, GROUP A STREP [facility])  POCT RAPID STREP A, ED / UC    EKG   Radiology No results found.  Procedures Procedures (including critical care time)  Medications Ordered in UC Medications  dexamethasone (DECADRON) injection 10 mg (has no administration in time range)    Initial Impression / Assessment and Plan / UC Course  I have reviewed the triage vital signs and the  nursing notes.  Pertinent labs & imaging results that were available during my care of the patient  were reviewed by me and considered in my medical decision making (see chart for details).     Vitals reassuring, rapid strep negative but given symptoms and exam findings will initiate treatment for bacterial pharyngitis with amoxicillin, viscous lidocaine.  IM Decadron given in clinic additionally for inflammation.  Throat culture pending, discussed over-the-counter supportive measures.  Return for acutely worsening symptoms.  Final Clinical Impressions(s) / UC Diagnoses   Final diagnoses:  Pharyngitis, unspecified etiology   Discharge Instructions   None    ED Prescriptions    Medication Sig Dispense Auth. Provider   amoxicillin (AMOXIL) 875 MG tablet Take 1 tablet (875 mg total) by mouth 2 (two) times daily. 20 tablet Particia Nearing, [place]   lidocaine (XYLOCAINE) 2 % solution Use as directed 10 mLs in the mouth or throat as needed for mouth pain. 100 mL Particia Nearing, [place]     PDMP not reviewed this encounter.   Particia Nearing, [place] 11/14/20 1413

## 2020-11-14 NOTE — Telephone Encounter (Signed)
Amoxil re-sent

## 2020-11-17 LAB — CULTURE, GROUP A STREP (THRC)

## 2020-11-18 ENCOUNTER — Telehealth (HOSPITAL_COMMUNITY): Payer: Self-pay

## 2020-11-18 MED ORDER — AMOXICILLIN 250 MG/5ML PO SUSR: 500.0000 mg | Freq: Two times a day (BID) | ORAL | 0 refills | Status: AC

## 2020-11-18 NOTE — Telephone Encounter (Signed)
Returned pt's phone call. Pt states she cannot swallow pills and needs medication ordered in liquid form. Yu, [place] notified and changing medication to liquid form. Pt updated and verbalized understanding.

## 2020-11-18 NOTE — Telephone Encounter (Signed)
Patient requiring liquid form of amoxil as unable to swallow pills. Was being treated for tonsillitis, strep negative. Amoxicillin called into pharmacy.

## 2021-01-13 ENCOUNTER — Ambulatory Visit (HOSPITAL_COMMUNITY)
Admission: EM | Admit: 2021-01-13 | Discharge: 2021-01-13 | Disposition: A | Discharge status: Home / Self Care | Payer: Medicaid Other | Attending: Emergency Medicine | Admitting: Emergency Medicine

## 2021-01-13 ENCOUNTER — Encounter (HOSPITAL_COMMUNITY): Payer: Self-pay | Admitting: Emergency Medicine

## 2021-01-13 DIAGNOSIS — H10403 Unspecified chronic conjunctivitis, bilateral: Secondary | ICD-10-CM | POA: Diagnosis not present

## 2021-01-13 MED ORDER — POLYMYXIN B-TRIMETHOPRIM 10000-0.1 UNIT/ML-% OP SOLN: 1.0000 [drp] | Freq: Four times a day (QID) | OPHTHALMIC | 1 refills | Status: DC

## 2021-01-13 NOTE — ED Provider Notes (Signed)
[facility]    CSN: [id number] Arrival date & time: 01/13/21  0844      History   Chief Complaint Chief Complaint  Patient presents with   Eye Problem    HPI Emily Lucas is a 20 y.o. female.   Pt here for redness, drainage to bil eyes burning. Has had a hx of several bacterial eye infections. States this is the same sx . Denies any cough, no congestion, no URI sx. Denies any fever. Denies any blurred vision or vision changes.    Past Medical History:  Diagnosis Date   Gallstones    Learning disability    "comprehension"- mother reported    Patient Active Problem List   Diagnosis Date Noted   Acute blood loss anemia 01/26/2020   Postpartum care following vaginal delivery 8/13 01/25/2020   Maternal anemia, with delivery - IDA with superimposed ABL 01/25/2020   Normal labor 01/24/2020   Supervision of normal first teen pregnancy 01/24/2020   Chorioamnionitis in third trimester, fetus 1 01/24/2020   Abnormal cholangiogram    S/P laparoscopic cholecystectomy 01/25/2017    Past Surgical History:  Procedure Laterality Date   cholecystecomy  01/24/2017   CHOLECYSTECTOMY     ERCP N/A 02/01/2017   Procedure: ENDOSCOPIC RETROGRADE CHOLANGIOPANCREATOGRAPHY (ERCP);  Surgeon: Rachael Fee, MD;  Location: [facility] ENDOSCOPY;  Service: Endoscopy;  Laterality: N/A;    OB History     Gravida  1   Para  1   Term  1   Preterm  0   AB  0   Living  1      SAB  0   IAB  0   Ectopic  0   Multiple  0   Live Births  1            Home Medications    Prior to Admission medications   Medication Sig Start Date End Date Taking? Authorizing Provider  lidocaine (XYLOCAINE) 2 % solution Use as directed 10 mLs in the mouth or throat as needed for mouth pain. 11/14/20   Particia Nearing, PA-C  mupirocin cream (BACTROBAN) 2 % Apply 1 application topically 2 (two) times daily. 09/09/20   Ivette Loyal, NP  Prenatal Vit-Fe Fumarate-FA  (MULTIVITAMIN-PRENATAL) 27-0.8 MG TABS tablet Take 1 tablet by mouth daily at 12 noon.    [provider]  trimethoprim-polymyxin b (POLYTRIM) ophthalmic solution Place 1-2 drops into both eyes every 6 (six) hours. 01/13/21   Coralyn Mark, NP  iron polysaccharides (FERREX 150) 150 MG capsule Take 1 capsule (150 mg total) by mouth daily. 01/27/20 04/30/20  June Leap, CNM    Family History Family History  Problem Relation Age of Onset   Cancer Paternal Grandfather     Social History Social History   Tobacco Use   Smoking status: Never   Smokeless tobacco: Never  Vaping Use   Vaping Use: Never used  Substance Use Topics   Alcohol use: No    Alcohol/week: 0.0 standard drinks   Drug use: No     Allergies   Motrin [ibuprofen]   Review of Systems Review of Systems  Constitutional:  Negative for appetite change, chills and fever.  HENT: Negative.    Eyes:  Positive for pain, discharge, redness and itching. Negative for photophobia and visual disturbance.  Respiratory: Negative.    Cardiovascular: Negative.   Gastrointestinal: Negative.   Genitourinary: Negative.   Neurological: Negative.     Physical Exam Triage  Vital Signs ED Triage Vitals  Enc Vitals Group     BP 01/13/21 0916 100/65     Pulse Rate 01/13/21 0916 69     Resp 01/13/21 0916 16     Temp 01/13/21 0916 98.5 F (36.9 C)     Temp Source 01/13/21 0916 Oral     SpO2 01/13/21 0916 98 %     Weight --      Height --      Head Circumference --      Peak Flow --      Pain Score 01/13/21 0915 0     Pain Loc --      Pain Edu? --      Excl. in GC? --    No data found.  Updated Vital Signs BP 100/65 (BP Location: Left Arm)   Pulse 69   Temp 98.5 F (36.9 C) (Oral)   Resp 16   LMP 01/11/2021   SpO2 98%   Visual Acuity Right Eye Distance:   Left Eye Distance:   Bilateral Distance:    Right Eye Near:   Left Eye Near:    Bilateral Near:     Physical Exam Constitutional:       Appearance: Normal appearance.  HENT:     Nose: Nose normal. No congestion or rhinorrhea.     Mouth/Throat:     Mouth: Mucous membranes are moist.     Pharynx: No oropharyngeal exudate or posterior oropharyngeal erythema.  Eyes:     General:        Right eye: Discharge present.        Left eye: Discharge present.    Comments: Thick yellow drainage from bil eyes, erythema noted to bil conjuctiva more in rt eye than leeft.   Cardiovascular:     Rate and Rhythm: Normal rate.  Pulmonary:     Effort: Pulmonary effort is normal.  Abdominal:     General: Abdomen is flat.  Musculoskeletal:     Cervical back: Normal range of motion.  Skin:    General: Skin is warm.  Neurological:     General: No focal deficit present.     Mental Status: She is alert.     UC Treatments / Results  Labs (all labs ordered are listed, but only abnormal results are displayed) Labs Reviewed - No data to display  EKG   Radiology No results found.  Procedures Procedures (including critical care time)  Medications Ordered in UC Medications - No data to display  Initial Impression / Assessment and Plan / UC Course  I have reviewed the triage vital signs and the nursing notes.  Pertinent labs & imaging results that were available during my care of the patient were reviewed by me and considered in my medical decision making (see chart for details).     Apply to bil eyes  Avoid touching eyes  May use saline to flush eyes if needed  Wash hands after use and touching eyes  Expressed that the last tx helped and would like the same today  Final Clinical Impressions(s) / UC Diagnoses   Final diagnoses:  Chronic bacterial conjunctivitis of both eyes     Discharge Instructions      Apply to bil eyes  Avoid touching eyes  May use saline to flush eyes if needed  Wash hands after use and touching eyes       ED Prescriptions     Medication Sig Dispense Auth. Provider    trimethoprim-polymyxin  b (POLYTRIM) ophthalmic solution Place 1-2 drops into both eyes every 6 (six) hours. 10 mL Coralyn Mark, NP      PDMP not reviewed this encounter.   Coralyn Mark, NP 01/13/21 [phone]

## 2021-01-13 NOTE — Discharge Instructions (Addendum)
Apply to bil eyes  Avoid touching eyes  May use saline to flush eyes if needed  Wash hands after use and touching eyes

## 2021-01-13 NOTE — ED Triage Notes (Signed)
Pt presents with right eye redness, drainage and itching. Pt also c/o of swollen lymph node on right side of face.

## 2021-01-21 ENCOUNTER — Other Ambulatory Visit: Payer: Self-pay

## 2021-01-21 ENCOUNTER — Ambulatory Visit (HOSPITAL_COMMUNITY)
Admission: EM | Admit: 2021-01-21 | Discharge: 2021-01-21 | Disposition: A | Discharge status: Home / Self Care | Payer: Medicaid Other | Attending: Emergency Medicine | Admitting: Emergency Medicine

## 2021-01-21 ENCOUNTER — Encounter (HOSPITAL_COMMUNITY): Payer: Self-pay

## 2021-01-21 DIAGNOSIS — B0089 Other herpesviral infection: Secondary | ICD-10-CM

## 2021-01-21 DIAGNOSIS — B9689 Other specified bacterial agents as the cause of diseases classified elsewhere: Secondary | ICD-10-CM

## 2021-01-21 DIAGNOSIS — L089 Local infection of the skin and subcutaneous tissue, unspecified: Secondary | ICD-10-CM | POA: Diagnosis not present

## 2021-01-21 MED ORDER — CEPHALEXIN 250 MG/5ML PO SUSR
500.0000 mg | Freq: Two times a day (BID) | ORAL | 0 refills | Status: AC
Start: 2021-01-21 — End: 2021-01-28

## 2021-01-21 MED ORDER — ACYCLOVIR 200 MG/5ML PO SUSP: 800.0000 mg | Freq: Two times a day (BID) | ORAL | 0 refills | Status: AC

## 2021-01-21 NOTE — ED Triage Notes (Signed)
Pt reports rash and bump in the nose x 3 days; swollen lymph nodes on neck x 2 weeks; skin infection related to piercings in the ears x 2-3 weeks.   Pt requested liquid medications.

## 2021-01-21 NOTE — Discharge Instructions (Addendum)
Take the Keflex twice a day for the next 7 days for the infection on the back of your ears.  You can apply antibiotic ointment or vaseline to your ears to help with the dryness and itching.    Take the acyclovir twice a day for the next 5 days for the infection in your nose.   Return or go to the Emergency Department if symptoms worsen or do not improve in the next few days.

## 2021-01-21 NOTE — ED Provider Notes (Signed)
[facility]    CSN: [id number] Arrival date & time: 01/21/21  1719      History   Chief Complaint Chief Complaint  Patient presents with   Rash    HPI Emily Lucas is a 20 y.o. female.   Patient here for evaluation of rash on back of ears and in nose.  Reports rash on back of ears is scaly and dry and near her ear piercing for the past week.  Denies any recent piercing's.  Also reports swollen lymph nodes in neck.  Reports nose with draining bumps for the past 2-3 days.  Has not taken any OTC medication or treatments.  Denies any trauma, injury, or other precipitating event.  Denies any specific alleviating or aggravating factors.  Denies any fevers, chest pain, shortness of breath, N/V/D, numbness, tingling, weakness, abdominal pain, or headaches.    The history is provided by the patient.  Rash  Past Medical History:  Diagnosis Date   Gallstones    Learning disability    "comprehension"- mother reported    Patient Active Problem List   Diagnosis Date Noted   Acute blood loss anemia 01/26/2020   Postpartum care following vaginal delivery 8/13 01/25/2020   Maternal anemia, with delivery - IDA with superimposed ABL 01/25/2020   Normal labor 01/24/2020   Supervision of normal first teen pregnancy 01/24/2020   Chorioamnionitis in third trimester, fetus 1 01/24/2020   Abnormal cholangiogram    S/P laparoscopic cholecystectomy 01/25/2017    Past Surgical History:  Procedure Laterality Date   cholecystecomy  01/24/2017   CHOLECYSTECTOMY     ERCP N/A 02/01/2017   Procedure: ENDOSCOPIC RETROGRADE CHOLANGIOPANCREATOGRAPHY (ERCP);  Surgeon: Rachael Fee, MD;  Location: [facility] ENDOSCOPY;  Service: Endoscopy;  Laterality: N/A;    OB History     Gravida  1   Para  1   Term  1   Preterm  0   AB  0   Living  1      SAB  0   IAB  0   Ectopic  0   Multiple  0   Live Births  1            Home Medications    Prior to Admission medications    Medication Sig Start Date End Date Taking? Authorizing Provider  acyclovir (ZOVIRAX) 200 MG/5ML suspension Take 20 mLs (800 mg total) by mouth 2 (two) times daily for 5 days. 01/21/21 01/26/21 Yes Ivette Loyal, NP  cephALEXin (KEFLEX) 250 MG/5ML suspension Take 10 mLs (500 mg total) by mouth 2 (two) times daily for 7 days. 01/21/21 01/28/21 Yes Ivette Loyal, NP  lidocaine (XYLOCAINE) 2 % solution Use as directed 10 mLs in the mouth or throat as needed for mouth pain. 11/14/20   Particia Nearing, PA-C  mupirocin cream (BACTROBAN) 2 % Apply 1 application topically 2 (two) times daily. 09/09/20   Ivette Loyal, NP  Prenatal Vit-Fe Fumarate-FA (MULTIVITAMIN-PRENATAL) 27-0.8 MG TABS tablet Take 1 tablet by mouth daily at 12 noon.    [provider]  trimethoprim-polymyxin b (POLYTRIM) ophthalmic solution Place 1-2 drops into both eyes every 6 (six) hours. 01/13/21   Coralyn Mark, NP  iron polysaccharides (FERREX 150) 150 MG capsule Take 1 capsule (150 mg total) by mouth daily. 01/27/20 04/30/20  June Leap, CNM    Family History Family History  Problem Relation Age of Onset   Cancer Paternal Grandfather     Social  History Social History   Tobacco Use   Smoking status: Never   Smokeless tobacco: Never  Vaping Use   Vaping Use: Never used  Substance Use Topics   Alcohol use: No    Alcohol/week: 0.0 standard drinks   Drug use: No     Allergies   Motrin [ibuprofen]   Review of Systems Review of Systems  Skin:  Positive for rash.  All other systems reviewed and are negative.   Physical Exam Triage Vital Signs ED Triage Vitals  Enc Vitals Group     BP 01/21/21 1822 103/66     Pulse Rate 01/21/21 1822 65     Resp 01/21/21 1822 16     Temp 01/21/21 1822 98.4 F (36.9 C)     Temp Source 01/21/21 1822 Oral     SpO2 01/21/21 1822 100 %     Weight --      Height --      Head Circumference --      Peak Flow --      Pain Score 01/21/21 1821 0     Pain  Loc --      Pain Edu? --      Excl. in GC? --    No data found.  Updated Vital Signs BP 103/66 (BP Location: Right Arm)   Pulse 65   Temp 98.4 F (36.9 C) (Oral)   Resp 16   LMP 01/11/2021   SpO2 100%   Breastfeeding No   Visual Acuity Right Eye Distance:   Left Eye Distance:   Bilateral Distance:    Right Eye Near:   Left Eye Near:    Bilateral Near:     Physical Exam Vitals and nursing note reviewed.  Constitutional:      General: She is not in acute distress.    Appearance: Normal appearance. She is not ill-appearing, toxic-appearing or diaphoretic.  HENT:     Head: Normocephalic and atraumatic.  Eyes:     Conjunctiva/sclera: Conjunctivae normal.  Cardiovascular:     Rate and Rhythm: Normal rate.     Pulses: Normal pulses.  Pulmonary:     Effort: Pulmonary effort is normal.  Abdominal:     General: Abdomen is flat.  Musculoskeletal:        General: Normal range of motion.     Cervical back: Normal range of motion.  Skin:    General: Skin is warm and dry.     Findings: Rash present. Rash is crusting, scaling and vesicular.     Comments: Bilateral ears with scaling, crusting rash (see photo below) Nose with vesicular rash (see photo below)  Neurological:     General: No focal deficit present.     Mental Status: She is alert and oriented to person, place, and time.  Psychiatric:        Mood and Affect: Mood normal.        UC Treatments / Results  Labs (all labs ordered are listed, but only abnormal results are displayed) Labs Reviewed - No data to display  EKG   Radiology No results found.  Procedures Procedures (including critical care time)  Medications Ordered in UC Medications - No data to display  Initial Impression / Assessment and Plan / UC Course  I have reviewed the triage vital signs and the nursing notes.  Pertinent labs & imaging results that were available during my care of the patient were reviewed by me and considered in  my medical decision making (see chart  for details).    Assessment negative for red flags or concerns.  Bilateral ears with bacterial infection of the skin, possible impetigo.  Will treat with keflex.  May apply antibiotic ointment or lotion to help with dryness and itching.   Herpes dermatitis of the nose.  Will treat with acyclovir.   Follow up as needed.  Final Clinical Impressions(s) / UC Diagnoses   Final diagnoses:  Herpes dermatitis  Bacterial skin infection     Discharge Instructions      Take the Keflex twice a day for the next 7 days for the infection on the back of your ears.  You can apply antibiotic ointment or vaseline to your ears to help with the dryness and itching.    Take the acyclovir twice a day for the next 5 days for the infection in your nose.   Return or go to the Emergency Department if symptoms worsen or do not improve in the next few days.        ED Prescriptions     Medication Sig Dispense Auth. Provider   cephALEXin (KEFLEX) 250 MG/5ML suspension Take 10 mLs (500 mg total) by mouth 2 (two) times daily for 7 days. 140 mL Ivette Loyal, NP   acyclovir (ZOVIRAX) 200 MG/5ML suspension Take 20 mLs (800 mg total) by mouth 2 (two) times daily for 5 days. 200 mL Ivette Loyal, NP      PDMP not reviewed this encounter.   Ivette Loyal, NP 01/21/21 [phone]

## 2021-02-26 ENCOUNTER — Other Ambulatory Visit: Payer: Self-pay

## 2021-02-26 ENCOUNTER — Ambulatory Visit (HOSPITAL_COMMUNITY)
Admission: EM | Admit: 2021-02-26 | Discharge: 2021-02-26 | Disposition: A | Discharge status: Home / Self Care | Payer: Medicaid Other | Attending: Emergency Medicine | Admitting: Emergency Medicine

## 2021-02-26 ENCOUNTER — Encounter (HOSPITAL_COMMUNITY): Payer: Self-pay | Admitting: Emergency Medicine

## 2021-02-26 DIAGNOSIS — M545 Low back pain, unspecified: Secondary | ICD-10-CM

## 2021-02-26 DIAGNOSIS — H579 Unspecified disorder of eye and adnexa: Secondary | ICD-10-CM

## 2021-02-26 MED ORDER — CYCLOBENZAPRINE HCL 10 MG PO TABS
10.0000 mg | ORAL_TABLET | Freq: Two times a day (BID) | ORAL | 0 refills | Status: DC | PRN
Start: 2021-02-26 — End: 2021-05-08

## 2021-02-26 MED ORDER — OLOPATADINE HCL 0.1 % OP SOLN: 1.0000 [drp] | Freq: Two times a day (BID) | OPHTHALMIC | 12 refills | Status: DC

## 2021-02-26 MED ORDER — METHYLPREDNISOLONE SODIUM SUCC 125 MG IJ SOLR
60.0000 mg | Freq: Once | INTRAMUSCULAR | Status: AC
  Administered 2021-02-26: 60 mg via INTRAMUSCULAR

## 2021-02-26 MED ORDER — METHYLPREDNISOLONE SODIUM SUCC 125 MG IJ SOLR
INTRAMUSCULAR | Status: AC
  Filled 2021-02-26: qty 2

## 2021-02-26 MED ORDER — NAPROXEN 125 MG/5ML PO SUSP: 500.0000 mg | Freq: Two times a day (BID) | ORAL | 0 refills | Status: AC

## 2021-02-26 NOTE — Discharge Instructions (Addendum)
Take Patanol, please 1 drop in eye, twice a day symptoms resolved  Can attempt use of naproxen 20 mL 2 times a day with food for the next 5 to 7 days then as needed, if hives occur at any point after use of medication, please take Benadryl 25 mg every 6 hours until resolution, if hives do not improve please return to urgent care for reevaluation  May use Flexeril twice a day as needed for additional comfort, May crush medicine in place and food to take pill, please be mindful this medication may make you drowsy if this occurs you may take half a dose, if this continues to occur you may use only at bedtime  You may use heating pad in 15 minute intervals as needed for additional comfort, within the first 2-3 days you may find comfort in using ice in 10-15 minutes over affected area  Begin stretching affected area daily for 10 minutes as tolerated to further loosen muscles   When lying down place pillow underneath and between knees for support  Can try sleeping without pillow on firm mattress   Practice good posture: head back, shoulders back, chest forward, pelvis back and weight distributed evenly on both legs  If pain persist after recommended treatment or reoccurs if may be beneficial to follow up with orthopedic specialist for evaluation, this doctor specializes in the bones and can manage your symptoms long-term with options such as but not limited to imaging, medications or physical therapy
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
## 2021-05-08 ENCOUNTER — Ambulatory Visit (HOSPITAL_COMMUNITY)
Admission: EM | Admit: 2021-05-08 | Discharge: 2021-05-08 | Disposition: A | Discharge status: Home / Self Care | Payer: Medicaid Other | Attending: Internal Medicine | Admitting: Internal Medicine

## 2021-05-08 ENCOUNTER — Encounter (HOSPITAL_COMMUNITY): Payer: Self-pay | Admitting: Emergency Medicine

## 2021-05-08 ENCOUNTER — Other Ambulatory Visit: Payer: Self-pay

## 2021-05-08 ENCOUNTER — Ambulatory Visit (HOSPITAL_COMMUNITY): Admit: 2021-05-08 | Payer: Self-pay

## 2021-05-08 DIAGNOSIS — N760 Acute vaginitis: Secondary | ICD-10-CM | POA: Insufficient documentation

## 2021-05-08 DIAGNOSIS — B9689 Other specified bacterial agents as the cause of diseases classified elsewhere: Secondary | ICD-10-CM | POA: Insufficient documentation

## 2021-05-08 LAB — POCT URINALYSIS DIPSTICK, ED / UC
Bilirubin Urine: NEGATIVE
Glucose, UA: NEGATIVE mg/dL
Hgb urine dipstick: NEGATIVE
Leukocytes,Ua: NEGATIVE
Nitrite: NEGATIVE
Protein, ur: NEGATIVE mg/dL
Specific Gravity, Urine: 1.02 (ref 1.005–1.030)
Urobilinogen, UA: 0.2 mg/dL (ref 0.0–1.0)
pH: 6 (ref 5.0–8.0)

## 2021-05-08 LAB — POC URINE PREG, ED: Preg Test, Ur: NEGATIVE

## 2021-05-08 MED ORDER — METRONIDAZOLE 0.75 % VA GEL
1.0000 | Freq: Every day | VAGINAL | 0 refills | Status: AC
Start: 2021-05-08 — End: 2021-05-13

## 2021-05-08 NOTE — Discharge Instructions (Signed)
Please begin metronidazole vaginal gel to treat your symptoms of bacterial vaginitis.  The results of your STD testing today will be made available to you once received.  They will be posted to your MyChart and, if any of your results are abnormal, you will receive a phone call with those results along with further instructions regarding treatment.

## 2021-05-08 NOTE — ED Provider Notes (Signed)
[facility]    CSN: [id number] Arrival date & time: 05/08/21  1824    HISTORY   Chief Complaint  Patient presents with   vaginal odor    HPI Emily Lucas is a 20 y.o. female. Patient states she noticed yesterday that she began to have vaginal odor that has persisted to today.  Patient states she had bacterial vaginitis in the past, states the odor and symptoms are the same.  Patient also requests STD testing today.  Patient denies fever, aches, chills, nausea, vomiting, diarrhea.  The history is provided by the patient.  Past Medical History:  Diagnosis Date   Gallstones    Learning disability    "comprehension"- mother reported   Patient Active Problem List   Diagnosis Date Noted   Acute blood loss anemia 01/26/2020   Postpartum care following vaginal delivery 8/13 01/25/2020   Maternal anemia, with delivery - IDA with superimposed ABL 01/25/2020   Normal labor 01/24/2020   Supervision of normal first teen pregnancy 01/24/2020   Chorioamnionitis in third trimester, fetus 1 01/24/2020   Abnormal cholangiogram    S/P laparoscopic cholecystectomy 01/25/2017   Past Surgical History:  Procedure Laterality Date   cholecystecomy  01/24/2017   CHOLECYSTECTOMY     ERCP N/A 02/01/2017   Procedure: ENDOSCOPIC RETROGRADE CHOLANGIOPANCREATOGRAPHY (ERCP);  Surgeon: Rachael Fee, MD;  Location: [facility]COPY;  Service: Endoscopy;  Laterality: N/A;   OB History     Gravida  1   Para  1   Term  1   Preterm  0   AB  0   Living  1      SAB  0   IAB  0   Ectopic  0   Multiple  0   Live Births  1          Home Medications    Prior to Admission medications   Medication Sig Start Date End Date Taking? Authorizing Provider  metroNIDAZOLE (METROGEL) 0.75 % vaginal gel Place 1 Applicatorful vaginally at bedtime for 5 days. Abstain from sexual intercourse until treatment is complete 05/08/21 05/13/21 Yes Theadora Rama Scales, PA-C  iron  polysaccharides (FERREX 150) 150 MG capsule Take 1 capsule (150 mg total) by mouth daily. 01/27/20 04/30/20  June Leap, CNM   Family History Family History  Problem Relation Age of Onset   Cancer Paternal Grandfather    Social History Social History   Tobacco Use   Smoking status: Never   Smokeless tobacco: Never  Vaping Use   Vaping Use: Never used  Substance Use Topics   Alcohol use: No    Alcohol/week: 0.0 standard drinks   Drug use: No   Allergies   Motrin [ibuprofen]  Review of Systems Review of Systems Pertinent findings noted in history of present illness.   Physical Exam Triage Vital Signs ED Triage Vitals  Enc Vitals Group     BP 04/10/21 0827 (!) 147/82     Pulse Rate 04/10/21 0827 72     Resp 04/10/21 0827 18     Temp 04/10/21 0827 98.3 F (36.8 C)     Temp Source 04/10/21 0827 Oral     SpO2 04/10/21 0827 98 %     Weight --      Height --      Head Circumference --      Peak Flow --      Pain Score 04/10/21 0826 5     Pain Loc --  Pain Edu? --      Excl. in [place]? --   No data found.  Updated Vital Signs BP 115/76 (BP Location: Right Arm)   Pulse 80   Temp 97.8 F (36.6 C) (Oral)   Resp 17   SpO2 98%   Breastfeeding No   Physical Exam Vitals and nursing note reviewed.  Constitutional:      General: She is not in acute distress.    Appearance: Normal appearance. She is not ill-appearing.  HENT:     Head: Normocephalic and atraumatic.  Eyes:     General: Lids are normal.        Right eye: No discharge.        Left eye: No discharge.     Extraocular Movements: Extraocular movements intact.     Conjunctiva/sclera: Conjunctivae normal.     Right eye: Right conjunctiva is not injected.     Left eye: Left conjunctiva is not injected.  Neck:     Trachea: Trachea and phonation normal.  Cardiovascular:     Rate and Rhythm: Normal rate and regular rhythm.     Pulses: Normal pulses.     Heart sounds: Normal heart sounds. No murmur  heard.   No friction rub. No gallop.  Pulmonary:     Effort: Pulmonary effort is normal. No accessory muscle usage, prolonged expiration or respiratory distress.     Breath sounds: Normal breath sounds. No stridor, decreased air movement or transmitted upper airway sounds. No decreased breath sounds, wheezing, rhonchi or rales.  Chest:     Chest wall: No tenderness.  Abdominal:     General: Abdomen is flat. Bowel sounds are normal. There is no distension.     Palpations: Abdomen is soft.     Tenderness: There is no abdominal tenderness. There is no right CVA tenderness or left CVA tenderness.     Hernia: No hernia is present.  Genitourinary:    Comments: Pt politely declines GU exam, pt did provide a swab for testing.   Musculoskeletal:        General: Normal range of motion.     Cervical back: Normal range of motion and neck supple. Normal range of motion.  Lymphadenopathy:     Cervical: No cervical adenopathy.  Skin:    General: Skin is warm and dry.     Findings: No erythema or rash.  Neurological:     General: No focal deficit present.     Mental Status: She is alert and oriented to person, place, and time.  Psychiatric:        Mood and Affect: Mood normal.        Behavior: Behavior normal.    Visual Acuity Right Eye Distance:   Left Eye Distance:   Bilateral Distance:    Right Eye Near:   Left Eye Near:    Bilateral Near:     UC Couse / Diagnostics / Procedures:    EKG  Radiology No results found.  Procedures Procedures (including critical care time)  UC Diagnoses / Final Clinical Impressions(s)    Final diagnoses:  Bacterial vaginitis   I have reviewed the triage vital signs and the nursing notes.  Pertinent labs & imaging results that were available during my care of the patient were reviewed by me and considered in my medical decision making (see chart for details).    Patient presents today with concerns for exposure to sexually transmitted disease,  requesting testing.  STD screening was performed as indicated.  Patient has been advised that the results of screening will be made available to them via MyChart and, if there are any positive findings, they will be contacted by phone, recommendations for treatment will be advised and prescriptions will be provided as indicated based on clinical guidelines.  Patient has also been advised that if treatment is recommended, they should abstain from sexual intercourse of all forms until treatment is complete.  Patient has further been advised that once treatment is complete, they have not had a complete resolution of their symptoms, if any, they should continue to abstain from sexual intercourse with all forms and follow-up with her primary care provider or return to urgent care for repeat testing.  Patient/parent/caregiver verbalized understanding and agreement of plan as discussed.  All questions were addressed during visit.  Please see discharge instructions below for further details of plan.  ED Prescriptions     Medication Sig Dispense Auth. Provider   metroNIDAZOLE (METROGEL) 0.75 % vaginal gel Place 1 Applicatorful vaginally at bedtime for 5 days. Abstain from sexual intercourse until treatment is complete 70 g Theadora Rama Scales, PA-C      PDMP not reviewed this encounter.  Pending results:  Labs Reviewed  POCT URINALYSIS DIPSTICK, ED / UC - Abnormal; Notable for the following components:      Result Value   Ketones, ur TRACE (*)    All other components within normal limits  POC URINE PREG, ED  CERVICOVAGINAL ANCILLARY ONLY     Medications Ordered in UC: Medications - No data to display  Discharge Instructions:   Discharge Instructions      Please begin metronidazole vaginal gel to treat your symptoms of bacterial vaginitis.  The results of your STD testing today will be made available to you once received.  They will be posted to your MyChart and, if any of your results  are abnormal, you will receive a phone call with those results along with further instructions regarding treatment.       Disposition Upon Discharge:   Routine symptom specific, illness specific and/or disease specific instructions were discussed with the patient and/or caregiver at length.  Prevention strategies for avoiding STD exposure were also discussed.  As such, the patient has been evaluated and assessed, work-up was performed and treatment was provided in alignment with urgent care protocols and evidence based medicine.  Patient/parent/caregiver has been advised that the patient may require follow up for further testing and/or treatment if the symptoms continue in spite of treatment, as clinically indicated and appropriate.  The patient will follow up with their current PCP if and as advised. If the patient does not currently have a PCP we will assist them in obtaining one.   The patient may need specialty follow up if the symptoms continue, in spite of conservative treatment and management, for further workup, evaluation, consultation and treatment as clinically indicated and appropriate.  Condition: stable for discharge home     Theadora Rama Scales, [place] 05/08/21 2024

## 2021-05-08 NOTE — ED Triage Notes (Signed)
Pt is present today with vaginal odor. Pt sx started yesterday

## 2021-05-11 LAB — CERVICOVAGINAL ANCILLARY ONLY
Bacterial Vaginitis (gardnerella): POSITIVE — AB
Candida Glabrata: NEGATIVE
Candida Vaginitis: NEGATIVE
Chlamydia: NEGATIVE
Comment: NEGATIVE
Comment: NEGATIVE
Comment: NEGATIVE
Comment: NEGATIVE
Comment: NEGATIVE
Comment: NORMAL
Neisseria Gonorrhea: NEGATIVE
Trichomonas: NEGATIVE

## 2021-06-20 ENCOUNTER — Encounter (HOSPITAL_COMMUNITY): Payer: Self-pay | Admitting: *Deleted

## 2021-06-20 ENCOUNTER — Ambulatory Visit (HOSPITAL_COMMUNITY)
Admission: EM | Admit: 2021-06-20 | Discharge: 2021-06-20 | Disposition: A | Discharge status: Home / Self Care | Payer: Medicaid Other

## 2021-06-20 ENCOUNTER — Other Ambulatory Visit: Payer: Self-pay

## 2021-06-20 DIAGNOSIS — H5789 Other specified disorders of eye and adnexa: Secondary | ICD-10-CM | POA: Diagnosis not present

## 2021-06-20 DIAGNOSIS — H1033 Unspecified acute conjunctivitis, bilateral: Secondary | ICD-10-CM | POA: Diagnosis not present

## 2021-06-20 MED ORDER — METHYLPREDNISOLONE SODIUM SUCC 125 MG IJ SOLR
80.0000 mg | Freq: Once | INTRAMUSCULAR | Status: AC
  Administered 2021-06-20: 80 mg via INTRAMUSCULAR

## 2021-06-20 MED ORDER — METHYLPREDNISOLONE SODIUM SUCC 125 MG IJ SOLR
INTRAMUSCULAR | Status: AC
  Filled 2021-06-20: qty 2

## 2021-06-20 MED ORDER — POLYMYXIN B-TRIMETHOPRIM 10000-0.1 UNIT/ML-% OP SOLN: 1.0000 [drp] | OPHTHALMIC | 0 refills | Status: AC

## 2021-06-20 NOTE — ED Provider Notes (Signed)
[place]    CSN: [id number] Arrival date & time: 06/20/21  1006      History   Chief Complaint Chief Complaint  Patient presents with   Eye Problem    HPI Emily Lucas is a 21 y.o. female.   Patient here today for evaluation of bilateral eye irritation that started a few days ago after she tried using false lashes. She has had similar reaction in the past. She has tried using her prior antibiotic drop RX without significant relief. She has not had any changes in vision. She has had some itching to her eyes.  The history is provided by the patient.  Eye Problem Associated symptoms: itching and redness   Associated symptoms: no discharge, no nausea and no vomiting    Past Medical History:  Diagnosis Date   Gallstones    Learning disability    "comprehension"- mother reported    Patient Active Problem List   Diagnosis Date Noted   Acute blood loss anemia 01/26/2020   Postpartum care following vaginal delivery 8/13 01/25/2020   Maternal anemia, with delivery - IDA with superimposed ABL 01/25/2020   Normal labor 01/24/2020   Supervision of normal first teen pregnancy 01/24/2020   Chorioamnionitis in third trimester, fetus 1 01/24/2020   Abnormal cholangiogram    S/P laparoscopic cholecystectomy 01/25/2017    Past Surgical History:  Procedure Laterality Date   cholecystecomy  01/24/2017   CHOLECYSTECTOMY     ERCP N/A 02/01/2017   Procedure: ENDOSCOPIC RETROGRADE CHOLANGIOPANCREATOGRAPHY (ERCP);  Surgeon: Milus Banister, MD;  Location: [facility];  Service: Endoscopy;  Laterality: N/A;    OB History     Gravida  1   Para  1   Term  1   Preterm  0   AB  0   Living  1      SAB  0   IAB  0   Ectopic  0   Multiple  0   Live Births  1            Home Medications    Prior to Admission medications   Medication Sig Start Date End Date Taking? Authorizing Provider  trimethoprim-polymyxin b (POLYTRIM) ophthalmic solution Place  1 drop into both eyes every 4 (four) hours for 7 days. 06/20/21 06/27/21 Yes Francene Finders, PA-C  UNKNOWN TO PATIENT Rx abx eye drops from previous eye infection   Yes [provider]  iron polysaccharides (FERREX 150) 150 MG capsule Take 1 capsule (150 mg total) by mouth daily. 01/27/20 04/30/20  Suzan Nailer, CNM    Family History Family History  Problem Relation Age of Onset   Healthy Mother    Healthy Father    Cancer Paternal Grandfather     Social History Social History   Tobacco Use   Smoking status: Never   Smokeless tobacco: Never  Vaping Use   Vaping Use: Never used  Substance Use Topics   Alcohol use: No    Alcohol/week: 0.0 standard drinks   Drug use: No     Allergies   Motrin [ibuprofen]   Review of Systems Review of Systems  Constitutional:  Negative for chills and fever.  HENT:  Negative for congestion and rhinorrhea.   Eyes:  Positive for redness and itching. Negative for discharge.  Respiratory:  Negative for cough and shortness of breath.   Gastrointestinal:  Negative for abdominal pain, nausea and vomiting.    Physical Exam Triage Vital Signs ED Triage  Vitals  Enc Vitals Group     BP 06/20/21 1041 (!) 121/53     Pulse Rate 06/20/21 1041 77     Resp 06/20/21 1041 16     Temp 06/20/21 1041 98.1 F (36.7 C)     Temp Source 06/20/21 1041 Oral     SpO2 06/20/21 1041 98 %     Weight --      Height --      Head Circumference --      Peak Flow --      Pain Score 06/20/21 1042 0     Pain Loc --      Pain Edu? --      Excl. in GC? --    No data found.  Updated Vital Signs BP (!) 121/53    Pulse 77    Temp 98.1 F (36.7 C) (Oral)    Resp 16    LMP 06/10/2021 (Approximate)    SpO2 98%    Breastfeeding No   Visual Acuity Right Eye Distance: 20/20 Left Eye Distance: 20/20 -1 Bilateral Distance: 20/20     Physical Exam Vitals and nursing note reviewed.  Constitutional:      General: She is not in acute distress.     Appearance: Normal appearance. She is not ill-appearing.  HENT:     Head: Normocephalic and atraumatic.  Eyes:     Extraocular Movements: Extraocular movements intact.     Conjunctiva/sclera:     Right eye: Right conjunctiva is injected.     Left eye: Left conjunctiva is injected.     Comments: Bilateral upper and lower lids mildly erythematous with edema  Cardiovascular:     Rate and Rhythm: Normal rate.  Pulmonary:     Effort: Pulmonary effort is normal.  Neurological:     Mental Status: She is alert.  Psychiatric:        Mood and Affect: Mood normal.        Behavior: Behavior normal.        Thought Content: Thought content normal.     UC Treatments / Results  Labs (all labs ordered are listed, but only abnormal results are displayed) Labs Reviewed - No data to display  EKG   Radiology No results found.  Procedures Procedures (including critical care time)  Medications Ordered in UC Medications  methylPREDNISolone sodium succinate (SOLU-MEDROL) 125 mg/2 mL injection 80 mg (80 mg Intramuscular Given 06/20/21 1103)    Initial Impression / Assessment and Plan / UC Course  I have reviewed the triage vital signs and the nursing notes.  Pertinent labs & imaging results that were available during my care of the patient were reviewed by me and considered in my medical decision making (see chart for details).    Will treat with antibiotic drops as well as steroid injection as I suspect that reaction is due to contact with glue she has sensitivity to. Recommended follow up if no improvement with treatment or if symptoms worsen in any way.   Final Clinical Impressions(s) / UC Diagnoses   Final diagnoses:  Acute conjunctivitis of both eyes, unspecified acute conjunctivitis type  Eye irritation   Discharge Instructions   None    ED Prescriptions     Medication Sig Dispense Auth. Provider   trimethoprim-polymyxin b (POLYTRIM) ophthalmic solution Place 1 drop into both  eyes every 4 (four) hours for 7 days. 10 mL Tomi Bamberger, PA-C      PDMP not reviewed this encounter.  Francene Finders, PA-C 06/20/21 1135

## 2021-06-20 NOTE — ED Triage Notes (Signed)
Red, swollen, slight pruritic bilat eyes onset 2 days ago after attempting to use false lashes.  Has been using abx drops (states previous Rx for same reaction to lashes in past).  Denies vision changes. Does not wear contact lenses.

## 2021-08-02 ENCOUNTER — Ambulatory Visit (HOSPITAL_COMMUNITY)
Admission: EM | Admit: 2021-08-02 | Discharge: 2021-08-02 | Disposition: A | Discharge status: Home / Self Care | Payer: Medicaid Other | Attending: Emergency Medicine | Admitting: Emergency Medicine

## 2021-08-02 ENCOUNTER — Encounter (HOSPITAL_COMMUNITY): Payer: Self-pay

## 2021-08-02 ENCOUNTER — Other Ambulatory Visit: Payer: Self-pay

## 2021-08-02 DIAGNOSIS — Z8759 Personal history of other complications of pregnancy, childbirth and the puerperium: Secondary | ICD-10-CM | POA: Insufficient documentation

## 2021-08-02 DIAGNOSIS — N898 Other specified noninflammatory disorders of vagina: Secondary | ICD-10-CM | POA: Insufficient documentation

## 2021-08-02 DIAGNOSIS — Z3201 Encounter for pregnancy test, result positive: Secondary | ICD-10-CM | POA: Diagnosis present

## 2021-08-02 LAB — POCT URINALYSIS DIPSTICK, ED / UC
Bilirubin Urine: NEGATIVE
Glucose, UA: NEGATIVE mg/dL
Hgb urine dipstick: NEGATIVE
Ketones, ur: NEGATIVE mg/dL
Leukocytes,Ua: NEGATIVE
Nitrite: NEGATIVE
Protein, ur: NEGATIVE mg/dL
Specific Gravity, Urine: 1.02 (ref 1.005–1.030)
Urobilinogen, UA: 1 mg/dL (ref 0.0–1.0)
pH: 7 (ref 5.0–8.0)

## 2021-08-02 LAB — POC URINE PREG, ED: Preg Test, Ur: POSITIVE — AB

## 2021-08-02 MED ORDER — METRONIDAZOLE 0.75 % VA GEL: 1.0000 | Freq: Every day | VAGINAL | 0 refills | Status: AC

## 2021-08-02 NOTE — ED Provider Notes (Signed)
[place]    CSN: [id number] Arrival date & time: 08/02/21  1217    HISTORY   Chief Complaint  Patient presents with   Vaginal Odor    HPI Emily Lucas is a 21 y.o. female. Patient complains of vaginal odor for 3 days.  Patient reports a history of BV in the past, states the odor is the same and has a fishy smell.  Patient states she prefers to use vaginal gel for treatment as opposed to metronidazole tablets.  Patient reports having an abortion at the end of January, urine hCG today is still positive.  Patient states she did not follow-up with her abortion provider as recommended, denies pelvic pain, pelvic pressure, fever, body ache.  Patient states she is currently menstruating.  Urine dip today is normal.  The history is provided by the patient.  Past Medical History:  Diagnosis Date   Gallstones    Learning disability    "comprehension"- mother reported   Patient Active Problem List   Diagnosis Date Noted   Acute blood loss anemia 01/26/2020   Postpartum care following vaginal delivery 8/13 01/25/2020   Maternal anemia, with delivery - IDA with superimposed ABL 01/25/2020   Normal labor 01/24/2020   Supervision of normal first teen pregnancy 01/24/2020   Chorioamnionitis in third trimester, fetus 1 01/24/2020   Abnormal cholangiogram    S/P laparoscopic cholecystectomy 01/25/2017   Past Surgical History:  Procedure Laterality Date   cholecystecomy  01/24/2017   CHOLECYSTECTOMY     ERCP N/A 02/01/2017   Procedure: ENDOSCOPIC RETROGRADE CHOLANGIOPANCREATOGRAPHY (ERCP);  Surgeon: Milus Banister, MD;  Location: [facility] ENDOSCOPY;  Service: Endoscopy;  Laterality: N/A;   OB History     Gravida  1   Para  1   Term  1   Preterm  0   AB  0   Living  1      SAB  0   IAB  0   Ectopic  0   Multiple  0   Live Births  1          Home Medications    Prior to Admission medications   Medication Sig Start Date End Date Taking? Authorizing  Provider  metroNIDAZOLE (METROGEL) 0.75 % vaginal gel Place 1 Applicatorful vaginally at bedtime for 5 days. Abstain from sexual intercourse until treatment is complete 08/02/21 08/07/21 Yes Lilia Pro, Ria Comment Scales, PA-C  UNKNOWN TO PATIENT Rx abx eye drops from previous eye infection    [provider]  iron polysaccharides (FERREX 150) 150 MG capsule Take 1 capsule (150 mg total) by mouth daily. 01/27/20 04/30/20  Suzan Nailer, CNM   Family History Family History  Problem Relation Age of Onset   Healthy Mother    Healthy Father    Cancer Paternal Grandfather    Social History Social History   Tobacco Use   Smoking status: Never   Smokeless tobacco: Never  Vaping Use   Vaping Use: Never used  Substance Use Topics   Alcohol use: No    Alcohol/week: 0.0 standard drinks   Drug use: No   Allergies   Motrin [ibuprofen]  Review of Systems Review of Systems Pertinent findings noted in history of present illness.   Physical Exam Triage Vital Signs ED Triage Vitals  Enc Vitals Group     BP 04/10/21 0827 (!) 147/82     Pulse Rate 04/10/21 0827 72     Resp 04/10/21 0827 18  Temp 04/10/21 0827 98.3 F (36.8 C)     Temp Source 04/10/21 0827 Oral     SpO2 04/10/21 0827 98 %     Weight --      Height --      Head Circumference --      Peak Flow --      Pain Score 04/10/21 0826 5     Pain Loc --      Pain Edu? --      Excl. in GC? --   No data found.  Updated Vital Signs BP 107/66 (BP Location: Left Arm)    Pulse 65    Temp 97.9 F (36.6 C) (Oral)    Resp 18    LMP 07/30/2021    SpO2 100%   Physical Exam Vitals and nursing note reviewed.  Constitutional:      General: She is not in acute distress.    Appearance: Normal appearance. She is not ill-appearing.  HENT:     Head: Normocephalic and atraumatic.  Eyes:     General: Lids are normal.        Right eye: No discharge.        Left eye: No discharge.     Extraocular Movements: Extraocular movements  intact.     Conjunctiva/sclera: Conjunctivae normal.     Right eye: Right conjunctiva is not injected.     Left eye: Left conjunctiva is not injected.  Neck:     Trachea: Trachea and phonation normal.  Cardiovascular:     Rate and Rhythm: Normal rate and regular rhythm.     Pulses: Normal pulses.     Heart sounds: Normal heart sounds. No murmur heard.   No friction rub. No gallop.  Pulmonary:     Effort: Pulmonary effort is normal. No accessory muscle usage, prolonged expiration or respiratory distress.     Breath sounds: Normal breath sounds. No stridor, decreased air movement or transmitted upper airway sounds. No decreased breath sounds, wheezing, rhonchi or rales.  Chest:     Chest wall: No tenderness.  Genitourinary:    Comments: Patient politely declines pelvic exam today, patient provided a vaginal swab for testing. Musculoskeletal:        General: Normal range of motion.     Cervical back: Normal range of motion and neck supple. Normal range of motion.  Lymphadenopathy:     Cervical: No cervical adenopathy.  Skin:    General: Skin is warm and dry.     Findings: No erythema or rash.  Neurological:     General: No focal deficit present.     Mental Status: She is alert and oriented to person, place, and time.  Psychiatric:        Mood and Affect: Mood normal.        Behavior: Behavior normal.    Visual Acuity Right Eye Distance:   Left Eye Distance:   Bilateral Distance:    Right Eye Near:   Left Eye Near:    Bilateral Near:     UC Couse / Diagnostics / Procedures:    EKG  Radiology No results found.  Procedures Procedures (including critical care time)  UC Diagnoses / Final Clinical Impressions(s)   I have reviewed the triage vital signs and the nursing notes.  Pertinent labs & imaging results that were available during my care of the patient were reviewed by me and considered in my medical decision making (see chart for details).    Final diagnoses:  Pregnancy test positive  Abortion history  Vaginal odor   Patient was provided with Metronidazole gel twice daily at bedtime for 5 days for empiric treatment of presumed gardnerella vaginosis based on the history provided to me today. Patient was advised to abstain from sexual intercourse for the next 7 days while being treated.  Patient was also advised to use condoms to protect themselves from STD exposure. STD screening was performed, patient advised that the results be posted to their MyChart and if any of the results are positive, they will be notified by phone, further treatment will be provided as indicated based on results of STD screening. Return precautions advised.  Drug allergies reviewed, all questions addressed.     ED Prescriptions     Medication Sig Dispense Auth. Provider   metroNIDAZOLE (METROGEL) 0.75 % vaginal gel Place 1 Applicatorful vaginally at bedtime for 5 days. Abstain from sexual intercourse until treatment is complete 70 g Lynden Oxford Scales, PA-C      PDMP not reviewed this encounter.  Pending results:  Labs Reviewed  POC URINE PREG, ED - Abnormal; Notable for the following components:      Result Value   Preg Test, Ur POSITIVE (*)    All other components within normal limits  POCT URINALYSIS DIPSTICK, ED / UC  POC URINE PREG, ED  CERVICOVAGINAL ANCILLARY ONLY    Medications Ordered in UC: Medications - No data to display  Disposition Upon Discharge:  Condition: stable for discharge home  Patient presented with concern for an acute illness with associated systemic symptoms and significant discomfort requiring urgent management. In my opinion, this is a condition that a prudent lay person (someone who possesses an average knowledge of health and medicine) may potentially expect to result in complications if not addressed urgently such as respiratory distress, impairment of bodily function or dysfunction of bodily organs.   As such, the patient  has been evaluated and assessed, work-up was performed and treatment was provided in alignment with urgent care protocols and evidence based medicine.  Patient/parent/caregiver has been advised that the patient may require follow up for further testing and/or treatment if the symptoms continue in spite of treatment, as clinically indicated and appropriate.  Routine symptom specific, illness specific and/or disease specific instructions were discussed with the patient and/or caregiver at length.  Prevention strategies for avoiding STD exposure were also discussed.  The patient will follow up with their current PCP if and as advised. If the patient does not currently have a PCP we will assist them in obtaining one.   The patient may need specialty follow up if the symptoms continue, in spite of conservative treatment and management, for further workup, evaluation, consultation and treatment as clinically indicated and appropriate.  Patient/parent/caregiver verbalized understanding and agreement of plan as discussed.  All questions were addressed during visit.  Please see discharge instructions below for further details of plan.  Discharge Instructions:   Discharge Instructions      Based on the history that you provided to me today, you were treated empirically for bacterial vaginitis with metronidazole gel, please insert 1 applicatorful once daily at bedtime for the next 5 nights.  Please abstain from sexual intercourse, tampon use while being treated.   The results of your STD testing today will be made available to you once they are complete, this typically takes 3 to 5 days.  They will initially be posted to your MyChart and, if any of your results are abnormal, you will  receive a phone call with those results along with further instructions regarding any further treatment, if needed.    Thank you for visiting urgent care today.  I appreciate the opportunity to participate in your care.      This office note has been dictated using Museum/gallery curator.  Unfortunately, and despite my best efforts, this method of dictation can sometimes lead to occasional typographical or grammatical errors.  I apologize in advance if this occurs.      Lynden Oxford Scales, PA-C 08/02/21 1528

## 2021-08-02 NOTE — ED Triage Notes (Signed)
Pt presents with vaginal odor X 3 days.

## 2021-08-02 NOTE — Discharge Instructions (Addendum)
Based on the history that you provided to me today, you were treated empirically for bacterial vaginitis with metronidazole gel, please insert 1 applicatorful once daily at bedtime for the next 5 nights.  Please abstain from sexual intercourse, tampon use while being treated.   The results of your STD testing today will be made available to you once they are complete, this typically takes 3 to 5 days.  They will initially be posted to your MyChart and, if any of your results are abnormal, you will receive a phone call with those results along with further instructions regarding any further treatment, if needed.    Thank you for visiting urgent care today.  I appreciate the opportunity to participate in your care.

## 2021-08-03 ENCOUNTER — Ambulatory Visit (HOSPITAL_COMMUNITY): Payer: Self-pay

## 2021-08-03 LAB — POC URINE PREG, ED: Preg Test, Ur: NEGATIVE

## 2021-08-04 ENCOUNTER — Telehealth (HOSPITAL_COMMUNITY): Payer: Self-pay | Admitting: Emergency Medicine

## 2021-08-04 LAB — CERVICOVAGINAL ANCILLARY ONLY
Bacterial Vaginitis (gardnerella): POSITIVE — AB
Candida Glabrata: NEGATIVE
Candida Vaginitis: POSITIVE — AB
Chlamydia: POSITIVE — AB
Comment: NEGATIVE
Comment: NEGATIVE
Comment: NEGATIVE
Comment: NEGATIVE
Comment: NEGATIVE
Comment: NORMAL
Neisseria Gonorrhea: NEGATIVE
Trichomonas: NEGATIVE

## 2021-08-04 MED ORDER — CLOTRIMAZOLE 1 % VA CREA: 1.0000 | TOPICAL_CREAM | Freq: Every day | VAGINAL | 0 refills | Status: DC

## 2021-08-04 MED ORDER — AZITHROMYCIN 250 MG PO TABS: 1000.0000 mg | ORAL_TABLET | Freq: Once | ORAL | 0 refills | Status: AC

## 2021-08-05 ENCOUNTER — Telehealth (HOSPITAL_COMMUNITY): Payer: Self-pay | Admitting: Emergency Medicine

## 2021-08-05 MED ORDER — AZITHROMYCIN 200 MG/5ML PO SUSR: 1000.0000 mg | Freq: Once | ORAL | 0 refills | Status: AC

## 2021-08-05 NOTE — Telephone Encounter (Signed)
Patient states she is unable to swallow pills.  States she will need a liquid antibiotic.  Reviewed with Lillia Abed, aPP and 1G of Azithromycin PO sent to pharmacy, patient updated

## 2021-09-08 ENCOUNTER — Other Ambulatory Visit: Payer: Self-pay

## 2021-09-08 ENCOUNTER — Encounter (HOSPITAL_COMMUNITY): Payer: Self-pay | Admitting: Emergency Medicine

## 2021-09-08 ENCOUNTER — Ambulatory Visit (HOSPITAL_COMMUNITY)
Admission: EM | Admit: 2021-09-08 | Discharge: 2021-09-08 | Disposition: A | Discharge status: Home / Self Care | Payer: Medicaid Other | Attending: Family Medicine | Admitting: Family Medicine

## 2021-09-08 DIAGNOSIS — Z113 Encounter for screening for infections with a predominantly sexual mode of transmission: Secondary | ICD-10-CM | POA: Diagnosis not present

## 2021-09-08 DIAGNOSIS — Z202 Contact with and (suspected) exposure to infections with a predominantly sexual mode of transmission: Secondary | ICD-10-CM

## 2021-09-08 DIAGNOSIS — Z3202 Encounter for pregnancy test, result negative: Secondary | ICD-10-CM | POA: Diagnosis not present

## 2021-09-08 LAB — POCT URINALYSIS DIPSTICK, ED / UC
Bilirubin Urine: NEGATIVE
Glucose, UA: NEGATIVE mg/dL
Ketones, ur: NEGATIVE mg/dL
Leukocytes,Ua: NEGATIVE
Nitrite: NEGATIVE
Protein, ur: NEGATIVE mg/dL
Specific Gravity, Urine: 1.025 (ref 1.005–1.030)
Urobilinogen, UA: 0.2 mg/dL (ref 0.0–1.0)
pH: 7 (ref 5.0–8.0)

## 2021-09-08 LAB — POC URINE PREG, ED: Preg Test, Ur: NEGATIVE

## 2021-09-08 LAB — HIV ANTIBODY (ROUTINE TESTING W REFLEX): HIV Screen 4th Generation wRfx: NONREACTIVE

## 2021-09-08 NOTE — ED Triage Notes (Signed)
Pt c/o vaginal odor that started today. Believes has BV denies any discharge.  ?

## 2021-09-08 NOTE — Discharge Instructions (Addendum)
Your urinalysis and urine pregnancy results are negative. ?As discussed, we will await the results of your cytology testing.  If your results are positive, you will be contacted regarding treatment. ?Recommend increasing condom use. ?Follow-up as needed. ?

## 2021-09-08 NOTE — ED Provider Notes (Signed)
?[facility] ? ? ? ?CSN: [id number] ?Arrival date & time: 09/08/21  1824 ? ? ?  ? ?History   ?Chief Complaint ?No chief complaint on file. ? ? ?HPI ?Emily Lucas is a 21 y.o. female.  ? ?The patient is a 21 year old female who presents for a vaginal odor.  She states she woke up this morning with the symptoms.  She also complains of minor vaginal itching.  She denies vaginal discharge, urinary symptoms, or abdominal pain.  She states her period is scheduled to start within the next couple of days.  She is currently on birth control, states that she just started.  States that she has had 1 female partner in the past 90 days with 0% condom use.  States that usually after she has intercourse and someone ejaculates in her, she develops a BV.  She states that after her last sexual encounter with her partner, which was over a week ago, she started having symptoms of BV.  States that she used the vaginal gel and just finished it a couple of days ago.  Prior history of STD. ? ? ? ?Past Medical History:  ?Diagnosis Date  ? Gallstones   ? Learning disability   ? "comprehension"- mother reported  ? ? ?Patient Active Problem List  ? Diagnosis Date Noted  ? Acute blood loss anemia 01/26/2020  ? Postpartum care following vaginal delivery 8/13 01/25/2020  ? Maternal anemia, with delivery - IDA with superimposed ABL 01/25/2020  ? Normal labor 01/24/2020  ? Supervision of normal first teen pregnancy 01/24/2020  ? Chorioamnionitis in third trimester, fetus 1 01/24/2020  ? Abnormal cholangiogram   ? S/P laparoscopic cholecystectomy 01/25/2017  ? ? ?Past Surgical History:  ?Procedure Laterality Date  ? cholecystecomy  01/24/2017  ? CHOLECYSTECTOMY    ? ERCP N/A 02/01/2017  ? Procedure: ENDOSCOPIC RETROGRADE CHOLANGIOPANCREATOGRAPHY (ERCP);  Surgeon: Rachael Fee, MD;  Location: [facility] ENDOSCOPY;  Service: Endoscopy;  Laterality: N/A;  ? ? ?OB History   ? ? Gravida  ?1  ? Para  ?1  ? Term  ?1  ? Preterm  ?0  ? AB  ?0  ? Living   ?1  ?  ? ? SAB  ?0  ? IAB  ?0  ? Ectopic  ?0  ? Multiple  ?0  ? Live Births  ?1  ?   ?  ?  ? ? ? ?Home Medications   ? ?Prior to Admission medications   ?Medication Sig Start Date End Date Taking? Authorizing Provider  ?clotrimazole (GYNE-LOTRIMIN) 1 % vaginal cream Place 1 Applicatorful vaginally at bedtime. 08/04/21   Merrilee Jansky, MD  ?iron polysaccharides (FERREX 150) 150 MG capsule Take 1 capsule (150 mg total) by mouth daily. 01/27/20 04/30/20  June Leap, CNM  ? ? ?Family History ?Family History  ?Problem Relation Age of Onset  ? Healthy Mother   ? Healthy Father   ? Cancer Paternal Grandfather   ? ? ?Social History ?Social History  ? ?Tobacco Use  ? Smoking status: Never  ? Smokeless tobacco: Never  ?Vaping Use  ? Vaping Use: Never used  ?Substance Use Topics  ? Alcohol use: No  ?  Alcohol/week: 0.0 standard drinks  ? Drug use: No  ? ? ? ?Allergies   ?Motrin [ibuprofen] ? ? ?Review of Systems ?Review of Systems  ?Constitutional: Negative.   ?Gastrointestinal: Negative.   ?Genitourinary:  Negative for vaginal bleeding, vaginal discharge and vaginal pain.  ?  Vaginal odor  ?Skin: Negative.   ?Psychiatric/Behavioral: Negative.    ? ? ?Physical Exam ?Triage Vital Signs ?ED Triage Vitals [09/08/21 2005]  ?Enc Vitals Group  ?   BP (!) 109/59  ?   Pulse Rate 87  ?   Resp 15  ?   Temp 98.6 ?F (37 ?C)  ?   Temp Source Oral  ?   SpO2 97 %  ?   Weight   ?   Height   ?   Head Circumference   ?   Peak Flow   ?   Pain Score 0  ?   Pain Loc   ?   Pain Edu?   ?   Excl. in GC?   ? ?No data found. ? ?Updated Vital Signs ?BP (!) 109/59 (BP Location: Left Arm)   Pulse 87   Temp 98.6 ?F (37 ?C) (Oral)   Resp 15   SpO2 97%  ? ?Visual Acuity ?Right Eye Distance:   ?Left Eye Distance:   ?Bilateral Distance:   ? ?Right Eye Near:   ?Left Eye Near:    ?Bilateral Near:    ? ?Physical Exam ?Vitals reviewed.  ?Constitutional:   ?   General: She is not in acute distress. ?   Appearance: Normal appearance.  ?HENT:  ?    Head: Normocephalic.  ?Cardiovascular:  ?   Rate and Rhythm: Normal rate and regular rhythm.  ?Pulmonary:  ?   Effort: Pulmonary effort is normal.  ?   Breath sounds: Normal breath sounds.  ?Abdominal:  ?   General: Bowel sounds are normal.  ?   Palpations: Abdomen is soft.  ?   Tenderness: There is no abdominal tenderness.  ?Skin: ?   General: Skin is warm and dry.  ?   Capillary Refill: Capillary refill takes less than 2 seconds.  ?Neurological:  ?   Mental Status: She is alert and oriented to person, place, and time.  ?Psychiatric:     ?   Mood and Affect: Mood normal.     ?   Behavior: Behavior normal.  ? ? ? ?UC Treatments / Results  ?Labs ?(all labs ordered are listed, but only abnormal results are displayed) ?Labs Reviewed  ?POCT URINALYSIS DIPSTICK, ED / UC - Abnormal; Notable for the following components:  ?    Result Value  ? Hgb urine dipstick TRACE (*)   ? All other components within normal limits  ?HIV ANTIBODY (ROUTINE TESTING W REFLEX)  ?RPR  ?POC URINE PREG, ED  ?CERVICOVAGINAL ANCILLARY ONLY  ? ? ?EKG ? ? ?Radiology ?No results found. ? ?Procedures ?Procedures (including critical care time) ? ?Medications Ordered in UC ?Medications - No data to display ? ?Initial Impression / Assessment and Plan / UC Course  ?I have reviewed the triage vital signs and the nursing notes. ? ?Pertinent labs & imaging results that were available during my care of the patient were reviewed by me and considered in my medical decision making (see chart for details). ? ?The patient is a 21 year old female who presents with vaginal symptoms.  Patient states she woke up with vaginal odor this morning.  She previously used the metronidazole gel after recent sexual encounter with her partner.  States that she used the gel because she normally develops BV after her partner ejaculates inside of her.  Despite having used the gel, she presents with vaginal odor.  She would like STI testing to include HIV/RPR.  We will wait to start  patient on any medication until her cytology results are received.  Urinalysis and urine pregnancy are negative.  There was trace hemoglobin found in her urine, but that is consistent with her menstruation starting.  Patient also advised to wait on her results before starting any medication, and to know exactly what is being treated.  Patient advised she will be contacted if her cytology results are positive.  Follow-up as needed. ?Final Clinical Impressions(s) / UC Diagnoses  ? ?Final diagnoses:  ?Encounter for screening examination for sexually transmitted disease  ? ? ? ?Discharge Instructions   ? ?  ?Your urinalysis and urine pregnancy results are negative. ?As discussed, we will await the results of your cytology testing.  If your results are positive, you will be contacted regarding treatment. ?Recommend increasing condom use. ?Follow-up as needed. ? ? ? ? ?ED Prescriptions   ?None ?  ? ?PDMP not reviewed this encounter. ?  ?Abran Cantor, NP ?09/08/21 2049 ? ?

## 2021-09-08 NOTE — ED Triage Notes (Signed)
No answer in waiting area.

## 2021-09-09 LAB — RPR: RPR Ser Ql: NONREACTIVE

## 2021-09-10 ENCOUNTER — Telehealth (HOSPITAL_COMMUNITY): Payer: Self-pay | Admitting: Emergency Medicine

## 2021-09-10 LAB — CERVICOVAGINAL ANCILLARY ONLY
Bacterial Vaginitis (gardnerella): POSITIVE — AB
Candida Glabrata: NEGATIVE
Candida Vaginitis: NEGATIVE
Chlamydia: NEGATIVE
Comment: NEGATIVE
Comment: NEGATIVE
Comment: NEGATIVE
Comment: NEGATIVE
Comment: NEGATIVE
Comment: NORMAL
Neisseria Gonorrhea: NEGATIVE
Trichomonas: NEGATIVE

## 2021-09-10 MED ORDER — METRONIDAZOLE 0.75 % VA GEL: 1.0000 | Freq: Every day | VAGINAL | 0 refills | Status: AC

## 2021-09-11 ENCOUNTER — Encounter (HOSPITAL_COMMUNITY): Payer: Self-pay

## 2021-09-11 ENCOUNTER — Ambulatory Visit (HOSPITAL_COMMUNITY)
Admission: RE | Admit: 2021-09-11 | Discharge: 2021-09-11 | Disposition: A | Discharge status: Home / Self Care | Payer: Medicaid Other | Source: Ambulatory Visit | Attending: Family Medicine | Admitting: Family Medicine

## 2021-09-11 VITALS — BP 98/61 | HR 76 | Temp 98.4°F | Resp 16

## 2021-09-11 DIAGNOSIS — H1033 Unspecified acute conjunctivitis, bilateral: Secondary | ICD-10-CM

## 2021-09-11 MED ORDER — OLOPATADINE HCL 0.1 % OP SOLN: 1.0000 [drp] | Freq: Two times a day (BID) | OPHTHALMIC | 0 refills | Status: DC | PRN

## 2021-09-11 MED ORDER — GENTAMICIN SULFATE 0.3 % OP SOLN: 1.0000 [drp] | Freq: Three times a day (TID) | OPHTHALMIC | 0 refills | Status: DC

## 2021-09-11 NOTE — ED Provider Notes (Signed)
?[facility] ? ? ? ?CSN: [id number] ?Arrival date & time: 09/11/21  1132 ? ? ?  ? ?History   ?Chief Complaint ?Chief Complaint  ?Patient presents with  ? Eye Problem  ?  I believe I think I have pink eye .. my symptoms is ... red eye, watery eyes, itchy eyes. - Entered by patient  ? APPT 1130  ? ? ?HPI ?Vitalia A Finks is a 21 y.o. female.  ? ? ?Eye Problem ?Here for redness and irritation in her eyes.  First her right eye was red and itchy beginning on March 29.  Then the next day her left eye was red and both of them were more intensely red.  She has had eye discharge.  She tried some old olopatadine drops and it did help a little bit. ? ?Past Medical History:  ?Diagnosis Date  ? Gallstones   ? Learning disability   ? "comprehension"- mother reported  ? ? ?Patient Active Problem List  ? Diagnosis Date Noted  ? Acute blood loss anemia 01/26/2020  ? Postpartum care following vaginal delivery 8/13 01/25/2020  ? Maternal anemia, with delivery - IDA with superimposed ABL 01/25/2020  ? Normal labor 01/24/2020  ? Supervision of normal first teen pregnancy 01/24/2020  ? Chorioamnionitis in third trimester, fetus 1 01/24/2020  ? Abnormal cholangiogram   ? S/P laparoscopic cholecystectomy 01/25/2017  ? ? ?Past Surgical History:  ?Procedure Laterality Date  ? cholecystecomy  01/24/2017  ? CHOLECYSTECTOMY    ? ERCP N/A 02/01/2017  ? Procedure: ENDOSCOPIC RETROGRADE CHOLANGIOPANCREATOGRAPHY (ERCP);  Surgeon: Rachael Fee, MD;  Location: [facility] ENDOSCOPY;  Service: Endoscopy;  Laterality: N/A;  ? ? ?OB History   ? ? Gravida  ?1  ? Para  ?1  ? Term  ?1  ? Preterm  ?0  ? AB  ?0  ? Living  ?1  ?  ? ? SAB  ?0  ? IAB  ?0  ? Ectopic  ?0  ? Multiple  ?0  ? Live Births  ?1  ?   ?  ?  ? ? ? ?Home Medications   ? ?Prior to Admission medications   ?Medication Sig Start Date End Date Taking? Authorizing Provider  ?clotrimazole (GYNE-LOTRIMIN) 1 % vaginal cream Place 1 Applicatorful vaginally at bedtime. 08/04/21  Yes Lamptey, Britta Mccreedy, MD  ?gentamicin (GARAMYCIN) 0.3 % ophthalmic solution Place 1 drop into both eyes 3 (three) times daily. 09/11/21  Yes Zenia Resides, MD  ?metroNIDAZOLE (METROGEL VAGINAL) 0.75 % vaginal gel Place 1 Applicatorful vaginally at bedtime for 5 days. 09/10/21 09/15/21 Yes Lamptey, Britta Mccreedy, MD  ?olopatadine (PATANOL) 0.1 % ophthalmic solution Place 1 drop into both eyes 2 (two) times daily as needed for allergies. 09/11/21  Yes Zenia Resides, MD  ?iron polysaccharides (FERREX 150) 150 MG capsule Take 1 capsule (150 mg total) by mouth daily. 01/27/20 04/30/20  June Leap, CNM  ? ? ?Family History ?Family History  ?Problem Relation Age of Onset  ? Healthy Mother   ? Healthy Father   ? Cancer Paternal Grandfather   ? ? ?Social History ?Social History  ? ?Tobacco Use  ? Smoking status: Never  ? Smokeless tobacco: Never  ?Vaping Use  ? Vaping Use: Never used  ?Substance Use Topics  ? Alcohol use: No  ?  Alcohol/week: 0.0 standard drinks  ? Drug use: No  ? ? ? ?Allergies   ?Motrin [ibuprofen] ? ? ?Review of Systems ?Review of Systems ? ? ?  Physical Exam ?Triage Vital Signs ?ED Triage Vitals  ?Enc Vitals Group  ?   BP 09/11/21 1211 98/61  ?   Pulse Rate 09/11/21 1211 76  ?   Resp 09/11/21 1211 16  ?   Temp 09/11/21 1211 98.4 ?F (36.9 ?C)  ?   Temp Source 09/11/21 1211 Oral  ?   SpO2 09/11/21 1211 98 %  ?   Weight --   ?   Height --   ?   Head Circumference --   ?   Peak Flow --   ?   Pain Score 09/11/21 1209 0  ?   Pain Loc --   ?   Pain Edu? --   ?   Excl. in GC? --   ? ?No data found. ? ?Updated Vital Signs ?BP 98/61 (BP Location: Right Arm)   Pulse 76   Temp 98.4 ?F (36.9 ?C) (Oral)   Resp 16   LMP 09/11/2021   SpO2 98%   Breastfeeding No  ? ?Visual Acuity ?Right Eye Distance: 20/20 ?Left Eye Distance: 20/20 ?Bilateral Distance: 20/20 ? ?Right Eye Near:   ?Left Eye Near:    ?Bilateral Near:    ? ?Physical Exam ?Vitals reviewed.  ?Constitutional:   ?   General: She is not in acute distress. ?   Appearance:  She is not toxic-appearing.  ?Eyes:  ?   Extraocular Movements: Extraocular movements intact.  ?   Pupils: Pupils are equal, round, and reactive to light.  ?   Comments: Both eyes are injected with dried discharge on the eyelids and slightly puffy eyelids  ?Skin: ?   Coloration: Skin is not jaundiced or pale.  ?Neurological:  ?   Mental Status: She is oriented to person, place, and time.  ?Psychiatric:     ?   Behavior: Behavior normal.  ? ? ? ?UC Treatments / Results  ?Labs ?(all labs ordered are listed, but only abnormal results are displayed) ?Labs Reviewed - No data to display ? ?EKG ? ? ?Radiology ?No results found. ? ?Procedures ?Procedures (including critical care time) ? ?Medications Ordered in UC ?Medications - No data to display ? ?Initial Impression / Assessment and Plan / UC Course  ?I have reviewed the triage vital signs and the nursing notes. ? ?Pertinent labs & imaging results that were available during my care of the patient were reviewed by me and considered in my medical decision making (see chart for details). ? ?  ? ?Since the olopatadine helped some I will send that in, but this looks more like infectious conjunctivitis, so I am going to send in some antibiotic eyedrops also ?Final Clinical Impressions(s) / UC Diagnoses  ? ?Final diagnoses:  ?Acute conjunctivitis of both eyes, unspecified acute conjunctivitis type  ? ? ? ?Discharge Instructions   ? ?  ?Use olapatadine eyedrops in both eyes twice daily as needed for allergies ? ?Put gentamicin eyedrops in both eyes 3 times daily for 5 days; this is an antibiotic ? ? ? ? ?ED Prescriptions   ? ? Medication Sig Dispense Auth. Provider  ? olopatadine (PATANOL) 0.1 % ophthalmic solution Place 1 drop into both eyes 2 (two) times daily as needed for allergies. 5 mL Zenia Resides, MD  ? gentamicin (GARAMYCIN) 0.3 % ophthalmic solution Place 1 drop into both eyes 3 (three) times daily. 5 mL Zenia Resides, MD  ? ?  ? ?PDMP not reviewed this  encounter. ?  ?Zenia Resides, MD ?09/11/21 1225 ? ?

## 2021-09-11 NOTE — Discharge Instructions (Addendum)
Use olapatadine eyedrops in both eyes twice daily as needed for allergies ? ?Put gentamicin eyedrops in both eyes 3 times daily for 5 days; this is an antibiotic ?

## 2021-09-11 NOTE — ED Triage Notes (Signed)
Patient c/o eye problem x 2 days.  ? ?Patient endorses bilateral eye redness. Patient endorses burning and watery eyes.  ? ?Patient endorses itchiness.  ? ?Patient denies any trauma to eye.  ? ?Patient endorses using " some old eye drops for pink eye yesterday" with some relief of symptoms.  ?

## 2021-09-21 ENCOUNTER — Ambulatory Visit (HOSPITAL_COMMUNITY)
Admission: RE | Admit: 2021-09-21 | Discharge: 2021-09-21 | Disposition: A | Discharge status: Home / Self Care | Payer: Medicaid Other | Source: Ambulatory Visit | Attending: Emergency Medicine | Admitting: Emergency Medicine

## 2021-09-21 ENCOUNTER — Encounter (HOSPITAL_COMMUNITY): Payer: Self-pay

## 2021-09-21 VITALS — BP 108/72 | HR 82 | Temp 98.0°F | Resp 18

## 2021-09-21 DIAGNOSIS — H1032 Unspecified acute conjunctivitis, left eye: Secondary | ICD-10-CM | POA: Diagnosis not present

## 2021-09-21 MED ORDER — POLYMYXIN B-TRIMETHOPRIM 10000-0.1 UNIT/ML-% OP SOLN: 1.0000 [drp] | Freq: Four times a day (QID) | OPHTHALMIC | 0 refills | Status: DC

## 2021-09-21 NOTE — ED Provider Notes (Signed)
?[facility] ? ? ? ?CSN: [id number] ?Arrival date & time: 09/21/21  1546 ? ? ?  ? ?History   ?Chief Complaint ?Chief Complaint  ?Patient presents with  ? APPOINTMENT: Eye Problem  ? ? ?HPI ?Emily Lucas is a 21 y.o. female.  ? ?Patient presents with concerns of pink eye in the left eye. She noticed it this morning upon waking. She has left eye redness, purulent discharge, and irritation. She was seen here on 3/31 with similar symptoms in both eyes and was prescribed gentamycin drops which she used for a few days until the redness cleared. She states the symptoms had been gone for a few days before returning this morning. She does not wear contacts. The patient inquires about getting more of the gentamycin drops. ? ?The history is provided by the patient.  ? ?Past Medical History:  ?Diagnosis Date  ? Gallstones   ? Learning disability   ? "comprehension"- mother reported  ? ? ?Patient Active Problem List  ? Diagnosis Date Noted  ? Acute blood loss anemia 01/26/2020  ? Postpartum care following vaginal delivery 8/13 01/25/2020  ? Maternal anemia, with delivery - IDA with superimposed ABL 01/25/2020  ? Normal labor 01/24/2020  ? Supervision of normal first teen pregnancy 01/24/2020  ? Chorioamnionitis in third trimester, fetus 1 01/24/2020  ? Abnormal cholangiogram   ? S/P laparoscopic cholecystectomy 01/25/2017  ? ? ?Past Surgical History:  ?Procedure Laterality Date  ? cholecystecomy  01/24/2017  ? CHOLECYSTECTOMY    ? ERCP N/A 02/01/2017  ? Procedure: ENDOSCOPIC RETROGRADE CHOLANGIOPANCREATOGRAPHY (ERCP);  Surgeon: Rachael Fee, MD;  Location: [facility] ENDOSCOPY;  Service: Endoscopy;  Laterality: N/A;  ? ? ?OB History   ? ? Gravida  ?1  ? Para  ?1  ? Term  ?1  ? Preterm  ?0  ? AB  ?0  ? Living  ?1  ?  ? ? SAB  ?0  ? IAB  ?0  ? Ectopic  ?0  ? Multiple  ?0  ? Live Births  ?1  ?   ?  ?  ? ? ? ?Home Medications   ? ?Prior to Admission medications   ?Medication Sig Start Date End Date Taking? Authorizing  Provider  ?trimethoprim-polymyxin b (POLYTRIM) ophthalmic solution Place 1 drop into the left eye every 6 (six) hours. 09/21/21  Yes Wilmer Santillo L, PA  ?clotrimazole (GYNE-LOTRIMIN) 1 % vaginal cream Place 1 Applicatorful vaginally at bedtime. 08/04/21   Merrilee Jansky, MD  ?olopatadine (PATANOL) 0.1 % ophthalmic solution Place 1 drop into both eyes 2 (two) times daily as needed for allergies. 09/11/21   Zenia Resides, MD  ?iron polysaccharides (FERREX 150) 150 MG capsule Take 1 capsule (150 mg total) by mouth daily. 01/27/20 04/30/20  June Leap, CNM  ? ? ?Family History ?Family History  ?Problem Relation Age of Onset  ? Healthy Mother   ? Healthy Father   ? Cancer Paternal Grandfather   ? ? ?Social History ?Social History  ? ?Tobacco Use  ? Smoking status: Never  ? Smokeless tobacco: Never  ?Vaping Use  ? Vaping Use: Never used  ?Substance Use Topics  ? Alcohol use: No  ?  Alcohol/week: 0.0 standard drinks  ? Drug use: No  ? ? ? ?Allergies   ?Motrin [ibuprofen] ? ? ?Review of Systems ?Review of Systems  ?Constitutional:  Negative for fever.  ?HENT:  Negative for congestion.   ?Eyes:  Positive for discharge and redness. Negative  for pain and visual disturbance.  ?Neurological:  Negative for dizziness and headaches.  ? ? ?Physical Exam ?Triage Vital Signs ?ED Triage Vitals  ?Enc Vitals Group  ?   BP 09/21/21 1611 108/72  ?   Pulse Rate 09/21/21 1611 82  ?   Resp 09/21/21 1611 18  ?   Temp 09/21/21 1611 98 ?F (36.7 ?C)  ?   Temp Source 09/21/21 1611 Oral  ?   SpO2 09/21/21 1611 100 %  ?   Weight --   ?   Height --   ?   Head Circumference --   ?   Peak Flow --   ?   Pain Score 09/21/21 1610 0  ?   Pain Loc --   ?   Pain Edu? --   ?   Excl. in GC? --   ? ?No data found. ? ?Updated Vital Signs ?BP 108/72 (BP Location: Right Arm)   Pulse 82   Temp 98 ?F (36.7 ?C) (Oral)   Resp 18   LMP 09/11/2021   SpO2 100%  ? ?Visual Acuity ?Right Eye Distance:   ?Left Eye Distance:   ?Bilateral Distance:   ? ?Right Eye  Near:   ?Left Eye Near:    ?Bilateral Near:    ? ?Physical Exam ?Vitals and nursing note reviewed.  ?Constitutional:   ?   General: She is not in acute distress. ?HENT:  ?   Head: Normocephalic.  ?Eyes:  ?   Extraocular Movements: Extraocular movements intact.  ?   Pupils: Pupils are equal, round, and reactive to light.  ?   Comments: L conjunctiva injected with purulent discharge and crusting. R without injection. EOMI without pain.   ?Cardiovascular:  ?   Pulses: Normal pulses.  ?Pulmonary:  ?   Effort: Pulmonary effort is normal.  ?Skin: ?   Findings: No rash.  ?Neurological:  ?   Mental Status: She is alert.  ?Psychiatric:     ?   Mood and Affect: Mood normal.  ? ? ? ?UC Treatments / Results  ?Labs ?(all labs ordered are listed, but only abnormal results are displayed) ?Labs Reviewed - No data to display ? ?EKG ? ? ?Radiology ?No results found. ? ?Procedures ?Procedures (including critical care time) ? ?Medications Ordered in UC ?Medications - No data to display ? ?Initial Impression / Assessment and Plan / UC Course  ?I have reviewed the triage vital signs and the nursing notes. ? ?Pertinent labs & imaging results that were available during my care of the patient were reviewed by me and considered in my medical decision making (see chart for details). ? ?  ? ?Recurrent conjunctivitis. Will change abx to cover for possible resistance to the gentamycin. Return precautions discussed.  ? ?E/M: 1 acute uncomplicated illness, no data, moderate risk due to prescription management ? ?Final Clinical Impressions(s) / UC Diagnoses  ? ?Final diagnoses:  ?Acute bacterial conjunctivitis of left eye  ? ? ? ?Discharge Instructions   ? ?  ?Use antibiotic drops as prescribed. A different antibiotic was sent in to cover for possible resistance to the first antibiotic. ?Follow-up with eye doctor if recurrent or persistent symptoms.  ? ? ? ?ED Prescriptions   ? ? Medication Sig Dispense Auth. Provider  ? trimethoprim-polymyxin b  (POLYTRIM) ophthalmic solution Place 1 drop into the left eye every 6 (six) hours. 10 mL Vallery Sa, Hagan Maltz L, PA  ? ?  ? ?PDMP not reviewed this encounter. ?  ?Wess Botts  L, PA ?09/21/21 1753 ? ?

## 2021-09-21 NOTE — ED Triage Notes (Signed)
Pt presents with left eye irritation and drainage since waking up this morning. 

## 2021-09-21 NOTE — Discharge Instructions (Addendum)
Use antibiotic drops as prescribed. A different antibiotic was sent in to cover for possible resistance to the first antibiotic. ?Follow-up with eye doctor if recurrent or persistent symptoms.  ?

## 2021-10-28 ENCOUNTER — Ambulatory Visit (HOSPITAL_COMMUNITY)
Admission: EM | Admit: 2021-10-28 | Discharge: 2021-10-28 | Disposition: A | Discharge status: Home / Self Care | Payer: Medicaid Other | Attending: Family Medicine | Admitting: Family Medicine

## 2021-10-28 ENCOUNTER — Encounter (HOSPITAL_COMMUNITY): Payer: Self-pay

## 2021-10-28 DIAGNOSIS — T7840XA Allergy, unspecified, initial encounter: Secondary | ICD-10-CM | POA: Diagnosis not present

## 2021-10-28 MED ORDER — PREDNISONE 20 MG PO TABS: 40.0000 mg | ORAL_TABLET | Freq: Every day | ORAL | 0 refills | Status: DC

## 2021-10-28 NOTE — ED Triage Notes (Signed)
Pt c/o nasal congestion, rash to arms, and puffy eyes x1wk. States thinks she is allergic to her dog. ?

## 2021-10-28 NOTE — ED Provider Notes (Signed)
?  [facility] ? ? ?[id number] ?10/28/21 Arrival Time: [id number] ? ?ASSESSMENT & PLAN: ? ?1. Allergy, initial encounter   ? ?Discussed typical duration of viral illnesses. ?OTC symptom care as needed. ?Elects trial of: ?Discharge Medication List as of 10/28/2021  3:15 PM  ?  ? ?START taking these medications  ? Details  ?predniSONE (DELTASONE) 20 MG tablet Take 2 tablets (40 mg total) by mouth daily., Starting Wed 10/28/2021, Normal  ?  ?  ?OTC allergy med recommended daily. ? ? Follow-up Information   ? ? [facility].   ?Specialty: Pediatrics ?Why: If worsening or failing to improve as anticipated. ?Contact information: ?[address] ?[place] [id number] ?[phone] ? ? ?  ?  ? ?  ?  ? ?  ? ?Reviewed expectations re: course of current medical issues. Questions answered. ?Outlined signs and symptoms indicating need for more acute intervention. ?Understanding verbalized. ?After Visit Summary given. ? ?SUBJECTIVE: ?History from: Patient. ?Emily Lucas is a 21 y.o. female. Reports: sneezing, coughing, itchy eyes, nasal congestion. Has noted when around her dogs. Denies: fever. Normal PO intake without n/v/d. ? ?OBJECTIVE: ? ?Vitals:  ? 10/28/21 1427  ?BP: 111/69  ?Pulse: 73  ?Resp: 18  ?Temp: 97.9 ?F (36.6 ?C)  ?TempSrc: Oral  ?SpO2: 98%  ?  ?General appearance: alert; no distress ?Eyes: PERRLA; EOMI; conjunctivae with slight injection and watery drainage ?HENT: [place]; AT; with nasal congestion ?Neck: supple  ?Lungs: speaks full sentences without difficulty; unlabored ?Extremities: no edema ?Skin: warm and dry ?Neurologic: normal gait ?Psychological: alert and cooperative; normal mood and affect ? ?Allergies  ?Allergen Reactions  ? Motrin [Ibuprofen] Hives  ? ?Past Medical History:  ?Diagnosis Date  ? Gallstones   ? Learning disability   ? "comprehension"- mother reported  ? ?Social History  ? ?Socioeconomic History  ? Marital status: Single  ?  Spouse name: Not on file  ? Number of  children: Not on file  ? Years of education: Not on file  ? Highest education level: Not on file  ?Occupational History  ? Not on file  ?Tobacco Use  ? Smoking status: Never  ? Smokeless tobacco: Never  ?Vaping Use  ? Vaping Use: Never used  ?Substance and Sexual Activity  ? Alcohol use: No  ?  Alcohol/week: 0.0 standard drinks  ? Drug use: No  ? Sexual activity: Yes  ?  Birth control/protection: Implant  ?Other Topics Concern  ? Not on file  ?Social History Narrative  ? Not on file  ? ?Social Determinants of Health  ? ?Financial Resource Strain: Not on file  ?Food Insecurity: Not on file  ?Transportation Needs: Not on file  ?Physical Activity: Not on file  ?Stress: Not on file  ?Social Connections: Not on file  ?Intimate Partner Violence: Not on file  ? ?Family History  ?Problem Relation Age of Onset  ? Healthy Mother   ? Healthy Father   ? Cancer Paternal Grandfather   ? ?Past Surgical History:  ?Procedure Laterality Date  ? cholecystecomy  01/24/2017  ? CHOLECYSTECTOMY    ? ERCP N/A 02/01/2017  ? Procedure: ENDOSCOPIC RETROGRADE CHOLANGIOPANCREATOGRAPHY (ERCP);  Surgeon: Milus Banister, MD;  Location: [facility] ENDOSCOPY;  Service: Endoscopy;  Laterality: N/A;  ? ?  ?Vanessa Kick, MD ?10/28/21 1548 ? ?

## 2021-11-04 ENCOUNTER — Encounter (HOSPITAL_COMMUNITY): Payer: Self-pay

## 2021-11-04 ENCOUNTER — Ambulatory Visit (HOSPITAL_COMMUNITY)
Admission: RE | Admit: 2021-11-04 | Discharge: 2021-11-04 | Disposition: A | Discharge status: Home / Self Care | Payer: Medicaid Other | Source: Ambulatory Visit | Attending: Physician Assistant | Admitting: Physician Assistant

## 2021-11-04 VITALS — BP 102/56 | HR 84 | Temp 98.3°F | Resp 18

## 2021-11-04 DIAGNOSIS — B9689 Other specified bacterial agents as the cause of diseases classified elsewhere: Secondary | ICD-10-CM

## 2021-11-04 DIAGNOSIS — H109 Unspecified conjunctivitis: Secondary | ICD-10-CM | POA: Diagnosis not present

## 2021-11-04 MED ORDER — OFLOXACIN 0.3 % OP SOLN: 1.0000 [drp] | Freq: Four times a day (QID) | OPHTHALMIC | 0 refills | Status: DC

## 2021-11-04 NOTE — ED Triage Notes (Signed)
Onset last night of bilateral eye redness. Pt reports recent tx for conjunctivitis. She believes that she used the same eye makeup from the last time that she had pink eye and may have re-infected herself. No decrease in visual acuity.

## 2021-11-04 NOTE — ED Provider Notes (Signed)
[facility]    CSN: [id number] Arrival date & time: 11/04/21  1117      History   Chief Complaint Chief Complaint  Patient presents with   1130appt   Conjunctivitis    HPI Emily Lucas is a 21 y.o. female.   Patient presents today with a several day history of bilateral eye redness.  She denies any significant discomfort, pain, visual disturbance, foreign body sensation, headache, dizziness, nausea, vomiting.  She does not wear glasses or contacts.  She was treated for bacterial conjunctivitis with Polytrim drops several months ago and believes that she did not replace her make-up and so reinfected herself.  She does intend to dispose of her make-up and replace this in the near future.  She denies any recent illness or additional symptoms.  Denies any known sick contacts.  She does have a young child but she has not been sick or had any eye symptoms.  Denies any chemical exposure or exposure to fine particulate matter.   Past Medical History:  Diagnosis Date   Gallstones    Learning disability    "comprehension"- mother reported    Patient Active Problem List   Diagnosis Date Noted   Acute blood loss anemia 01/26/2020   Postpartum care following vaginal delivery 8/13 01/25/2020   Maternal anemia, with delivery - IDA with superimposed ABL 01/25/2020   Normal labor 01/24/2020   Supervision of normal first teen pregnancy 01/24/2020   Chorioamnionitis in third trimester, fetus 1 01/24/2020   Abnormal cholangiogram    S/P laparoscopic cholecystectomy 01/25/2017    Past Surgical History:  Procedure Laterality Date   cholecystecomy  01/24/2017   CHOLECYSTECTOMY     ERCP N/A 02/01/2017   Procedure: ENDOSCOPIC RETROGRADE CHOLANGIOPANCREATOGRAPHY (ERCP);  Surgeon: Rachael Fee, MD;  Location: [facility] END[facility];  Service: Endoscopy;  Laterality: N/A;    OB History     Gravida  1   Para  1   Term  1   Preterm  0   AB  0   Living  1      SAB  0   IAB   0   Ectopic  0   Multiple  0   Live Births  1            Home Medications    Prior to Admission medications   Medication Sig Start Date End Date Taking? Authorizing Provider  ofloxacin (OCUFLOX) 0.3 % ophthalmic solution Place 1 drop into both eyes 4 (four) times daily. 11/04/21  Yes Wilbon Obenchain K, PA-C  predniSONE (DELTASONE) 20 MG tablet Take 2 tablets (40 mg total) by mouth daily. 10/28/21   Mardella Layman, MD  iron polysaccharides (FERREX 150) 150 MG capsule Take 1 capsule (150 mg total) by mouth daily. 01/27/20 04/30/20  June Leap, CNM    Family History Family History  Problem Relation Age of Onset   Healthy Mother    Healthy Father    Cancer Paternal Grandfather     Social History Social History   Tobacco Use   Smoking status: Never   Smokeless tobacco: Never  Vaping Use   Vaping Use: Never used  Substance Use Topics   Alcohol use: No    Alcohol/week: 0.0 standard drinks   Drug use: No     Allergies   Motrin [ibuprofen]   Review of Systems Review of Systems  Constitutional:  Negative for activity change, appetite change, fatigue and fever.  Eyes:  Positive for discharge and  redness. Negative for photophobia, pain, itching and visual disturbance.  Gastrointestinal:  Negative for abdominal pain, diarrhea, nausea and vomiting.  Neurological:  Negative for dizziness, light-headedness and headaches.    Physical Exam Triage Vital Signs ED Triage Vitals [11/04/21 1142]  Enc Vitals Group     BP (!) 102/56     Pulse Rate 84     Resp 18     Temp 98.3 F (36.8 C)     Temp Source Oral     SpO2 100 %     Weight      Height      Head Circumference      Peak Flow      Pain Score 0     Pain Loc      Pain Edu?      Excl. in GC?    No data found.  Updated Vital Signs BP (!) 102/56 (BP Location: Left Arm)   Pulse 84   Temp 98.3 F (36.8 C) (Oral)   Resp 18   SpO2 100%   Visual Acuity Right Eye Distance:   Left Eye Distance:   Bilateral  Distance:    Right Eye Near:   Left Eye Near:    Bilateral Near:     Physical Exam Vitals reviewed.  Constitutional:      General: She is awake. She is not in acute distress.    Appearance: Normal appearance. She is well-developed. She is not ill-appearing.     Comments: Very pleasant female appears stated age in no acute distress sitting comfortably in exam room  HENT:     Head: Normocephalic and atraumatic.  Eyes:     General:        Right eye: No hordeolum.        Left eye: No hordeolum.     Extraocular Movements: Extraocular movements intact.     Conjunctiva/sclera:     Right eye: Right conjunctiva is injected.     Left eye: Left conjunctiva is injected.     Pupils: Pupils are equal, round, and reactive to light.  Cardiovascular:     Rate and Rhythm: Normal rate and regular rhythm.     Heart sounds: Normal heart sounds, S1 normal and S2 normal. No murmur heard. Pulmonary:     Effort: Pulmonary effort is normal.     Breath sounds: Normal breath sounds. No wheezing, rhonchi or rales.     Comments: Clear to auscultation bilaterally Abdominal:     Palpations: Abdomen is soft.     Tenderness: There is no abdominal tenderness.  Psychiatric:        Behavior: Behavior is cooperative.     UC Treatments / Results  Labs (all labs ordered are listed, but only abnormal results are displayed) Labs Reviewed - No data to display  EKG   Radiology No results found.  Procedures Procedures (including critical care time)  Medications Ordered in UC Medications - No data to display  Initial Impression / Assessment and Plan / UC Course  I have reviewed the triage vital signs and the nursing notes.  Pertinent labs & imaging results that were available during my care of the patient were reviewed by me and considered in my medical decision making (see chart for details).     Fluorescein sitting was deferred as patient denies any ocular injury or foreign body sensation.  She  denies any alarm symptoms that would warrant emergent evaluation.  Will treat for bacterial etiology given recent bacterial conjunctivitis with  potential to have reinfected herself.  Discussed the importance of disposing of all eye make-up to prevent reinfection in the future.  Ofloxacin sent to pharmacy.  She can use lubricating eyedrops/artificial tears for additional symptom relief.  If symptoms or not improving she is to follow-up with ophthalmology and was given contact information for local provider with instruction to call to schedule an appointment.  Strict return precautions given to which she expressed understanding.  Final Clinical Impressions(s) / UC Diagnoses   Final diagnoses:  Bacterial conjunctivitis of both eyes     Discharge Instructions      We are giving you for infection of your eyes.  Please use drops 4 times daily in each eye.  Make sure to wash your hands prior to handling medication and avoid touching tip of bottle to your eye to prevent contamination.  Make sure you dispose of and replace all of your eye make-up.  If your symptoms or not improving quickly or if anything worsens you need to follow-up with ophthalmology; call to schedule an appointment.  If you develop any severe symptoms including fever, difficulty seeing, headache, nausea, vomiting you need to be seen immediately.     ED Prescriptions     Medication Sig Dispense Auth. Provider   ofloxacin (OCUFLOX) 0.3 % ophthalmic solution Place 1 drop into both eyes 4 (four) times daily. 5 mL Jazmeen Axtell K, PA-C      PDMP not reviewed this encounter.   Jeani Hawking, PA-C 11/04/21 1205

## 2021-11-04 NOTE — Discharge Instructions (Signed)
We are giving you for infection of your eyes.  Please use drops 4 times daily in each eye.  Make sure to wash your hands prior to handling medication and avoid touching tip of bottle to your eye to prevent contamination.  Make sure you dispose of and replace all of your eye make-up.  If your symptoms or not improving quickly or if anything worsens you need to follow-up with ophthalmology; call to schedule an appointment.  If you develop any severe symptoms including fever, difficulty seeing, headache, nausea, vomiting you need to be seen immediately.

## 2021-11-17 ENCOUNTER — Ambulatory Visit (HOSPITAL_COMMUNITY)
Admission: RE | Admit: 2021-11-17 | Discharge: 2021-11-17 | Disposition: A | Discharge status: Home / Self Care | Payer: Medicaid Other | Source: Ambulatory Visit | Attending: Physician Assistant | Admitting: Physician Assistant

## 2021-11-17 ENCOUNTER — Encounter (HOSPITAL_COMMUNITY): Payer: Self-pay

## 2021-11-17 VITALS — BP 106/70 | HR 78 | Temp 98.2°F | Resp 16

## 2021-11-17 DIAGNOSIS — H1033 Unspecified acute conjunctivitis, bilateral: Secondary | ICD-10-CM

## 2021-11-17 DIAGNOSIS — H5789 Other specified disorders of eye and adnexa: Secondary | ICD-10-CM

## 2021-11-17 DIAGNOSIS — H1013 Acute atopic conjunctivitis, bilateral: Secondary | ICD-10-CM | POA: Diagnosis not present

## 2021-11-17 MED ORDER — FLUORESCEIN SODIUM 1 MG OP STRP
ORAL_STRIP | OPHTHALMIC | Status: AC
  Filled 2021-11-17: qty 3

## 2021-11-17 MED ORDER — OFLOXACIN 0.3 % OP SOLN: 1.0000 [drp] | Freq: Four times a day (QID) | OPHTHALMIC | 0 refills | Status: DC

## 2021-11-17 MED ORDER — TETRACAINE HCL 0.5 % OP SOLN
OPHTHALMIC | Status: AC
  Filled 2021-11-17: qty 4

## 2021-11-17 NOTE — Discharge Instructions (Signed)
Advised using cool compresses 3-4 times throughout the day over the next couple days to help reduce swelling the eyelids. Advised to use the eyedrops ofloxacin 1 drop into both eyes 4 times a day for the next couple days until the redness clears. Advised to wash hands frequently and take precautions to reoccurrence. Advised to watch exposure to pets, take some allergy medicine like Claritin Flonase nasal spray to help congestion. Ensure to keep the appointment with pulmonology Dr. Geanie Berlin for evaluation for recurrent conjunctivitis.

## 2021-11-17 NOTE — ED Provider Notes (Signed)
[facility]    CSN: [id number] Arrival date & time: 11/17/21  [id number]      History   Chief Complaint Chief Complaint  Patient presents with   Eye Problem    HPI Emily Lucas is a 21 y.o. female.   21 year old female presents with bilateral eye redness.  Patient relates that for the past 2 days she has been having increasing redness of both eyes, matting in the morning, purulent discharge, and lid swelling.  Patient relates that she has not had any vision changes.  She can see normally on both eyes.  Patient does have mild light sensitivity.  Patient relates that she was treated about a week ago for similar infection she did use the ofloxacin drops and this did clear the infection, she was cleared for about a little less than a week.  Patient relates that then the infection recurred.  Patient indicates that she has had recurrent bilateral redness swelling and infections over the past 6 to 8 weeks.  Patient relates that she really has had this condition off and on over the past year.  Patient just does not understand why she gets recurrent eye infections she does relate that is have some allergy to dog.  Patient indicates that the dog sheds a lot and her notices that she gets congested runny nose when she is exposed to the abdominal wound for prolonged periods.  Patient does use Flonase nasal spray and antihistamine but this does not seem to control her symptoms.  Patient relates she is not having any fever chills, no runny nose or sinus congestion. 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   Eye Problem Associated symptoms: discharge, photophobia and redness    Past Medical History:  Diagnosis Date   Gallstones    Learning disability    "comprehension"- mother reported    Patient Active Problem List    Diagnosis Date Noted   Acute blood loss anemia 01/26/2020   Postpartum care following vaginal delivery 8/13 01/25/2020   Maternal anemia, with delivery - IDA with superimposed ABL 01/25/2020   Normal labor 01/24/2020   Supervision of normal first teen pregnancy 01/24/2020   Chorioamnionitis in third trimester, fetus 1 01/24/2020   Abnormal cholangiogram    S/P laparoscopic cholecystectomy 01/25/2017    Past Surgical History:  Procedure Laterality Date   cholecystecomy  01/24/2017   CHOLECYSTECTOMY     ERCP N/A 02/01/2017   Procedure: ENDOSCOPIC RETROGRADE CHOLANGIOPANCREATOGRAPHY (ERCP);  Surgeon: Rachael FeeJacobs, Daniel P, MD;  [facility];  Service: Endoscopy;  Laterality: N/A;    OB History     Gravida  1   Para  1   Term  1   Preterm  0   AB  0   Living  1      SAB  0   IAB  0   Ectopic  0   Multiple  0   Live Births  1            Home Medications    Prior to Admission medications   Medication Sig Start Date End Date Taking? Authorizing Provider  ofloxacin (OCUFLOX) 0.3 % ophthalmic solution Place 1 drop into both eyes 4 (four) times daily. 11/17/21   Ellsworth LennoxJames, Quy Lotts, PA-C  predniSONE (DELTASONE) 20 MG tablet Take 2 tablets (  40 mg total) by mouth daily. 10/28/21   Mardella Layman, MD  iron polysaccharides (FERREX 150) 150 MG capsule Take 1 capsule (150 mg total) by mouth daily. 01/27/20 04/30/20  June Leap, CNM    Family History Family History  Problem Relation Age of Onset   Healthy Mother    Healthy Father    Cancer Paternal Grandfather     Social History Social History   Tobacco Use   Smoking status: Never   Smokeless tobacco: Never  Vaping Use   Vaping Use: Never used  Substance Use Topics   Alcohol use: No    Alcohol/week: 0.0 standard drinks   Drug use: No     Allergies   Motrin [ibuprofen]   Review of Systems Review of Systems  Eyes:  Positive for photophobia, discharge and redness.    Physical Exam Triage Vital  Signs ED Triage Vitals [11/17/21 0848]  Enc Vitals Group     BP 106/70     Pulse Rate 78     Resp 16     Temp 98.2 F (36.8 C)     Temp Source Oral     SpO2 97 %     Weight      Height      Head Circumference      Peak Flow      Pain Score 1     Pain Loc      Pain Edu?      Excl. in GC?    No data found.  Updated Vital Signs BP 106/70   Pulse 78   Temp 98.2 F (36.8 C) (Oral)   Resp 16   SpO2 97%   Breastfeeding No   Visual Acuity Right Eye Distance: 20/20 Left Eye Distance: 20/30 Bilateral Distance: 20/25  Right Eye Near:   Left Eye Near:    Bilateral Near:     Physical Exam Constitutional:      Appearance: Normal appearance.  HENT:     Right Ear: Tympanic membrane and ear canal normal.     Left Ear: Tympanic membrane and ear canal normal.     Mouth/Throat:     Mouth: Mucous membranes are moist.     Pharynx: Oropharynx is clear. No pharyngeal swelling or oropharyngeal exudate.  Eyes:     Comments: Eyes: There is mild swelling of the lids bilaterally.  The conjunctiva is injected bilaterally, and there is purulent exudate and crusting noted at the lid margins bilaterally.  Both eyes were stained with flourastain. There is no evidence of dendritic lesions, corneal abrasions, or foreign bodies bilaterally.   Neurological:     Mental Status: She is alert.     UC Treatments / Results  Labs (all labs ordered are listed, but only abnormal results are displayed) Labs Reviewed - No data to display  EKG   Radiology No results found.  Procedures Procedures (including critical care time)  Medications Ordered in UC Medications - No data to display  Initial Impression / Assessment and Plan / UC Course  I have reviewed the triage vital signs and the nursing notes.  Pertinent labs & imaging results that were available during my care of the patient were reviewed by me and considered in my medical decision making (see chart for details).    Plan: 1.   Patient is referred to ophthalmology-Dr. Pecan-for evaluation of recurrent conjunctivitis. 2.  Patient is advised to use the ofloxacin drops as directed 1 drop each eye 4 times a day  till redness clears. 3.  Patient is advised to continue taking the antihistamine and nasal spray to prevent allergies from occurring due to dog exposure. 4.  Patient is advised to follow-up with family practice or return to urgent care if symptoms fail to improve. Final Clinical Impressions(s) / UC Diagnoses   Final diagnoses:  Acute bacterial conjunctivitis of both eyes  Eye irritation  Allergic conjunctivitis of both eyes     Discharge Instructions      Advised using cool compresses 3-4 times throughout the day over the next couple days to help reduce swelling the eyelids. Advised to use the eyedrops ofloxacin 1 drop into both eyes 4 times a day for the next couple days until the redness clears. Advised to wash hands frequently and take precautions to reoccurrence. Advised to watch exposure to pets, take some allergy medicine like Claritin Flonase nasal spray to help congestion. Ensure to keep the appointment with pulmonology Dr. Henreitta Cea for evaluation for recurrent conjunctivitis.    ED Prescriptions     Medication Sig Dispense Auth. Provider   ofloxacin (OCUFLOX) 0.3 % ophthalmic solution Place 1 drop into both eyes 4 (four) times daily. 5 mL Ellsworth Lennox, PA-C      PDMP not reviewed this encounter.   Ellsworth Lennox, PA-C 11/17/21 [phone]

## 2021-11-17 NOTE — ED Triage Notes (Addendum)
Per pt, started with bilat eye redness, irritation, soreness, and crusting onset yesterday. States was seen 11/03/21 & completed course of Oflaxacin opth gtts when she had some congestion. Does not wear contact lenses. Denies vision changes.

## 2021-12-02 ENCOUNTER — Encounter (HOSPITAL_COMMUNITY): Payer: Self-pay

## 2021-12-02 ENCOUNTER — Ambulatory Visit (HOSPITAL_COMMUNITY)
Admission: RE | Admit: 2021-12-02 | Discharge: 2021-12-02 | Disposition: A | Discharge status: Home / Self Care | Payer: Medicaid Other | Source: Ambulatory Visit

## 2021-12-02 VITALS — BP 90/66 | HR 89 | Temp 98.0°F | Resp 16

## 2021-12-02 DIAGNOSIS — H1013 Acute atopic conjunctivitis, bilateral: Secondary | ICD-10-CM

## 2021-12-02 DIAGNOSIS — J302 Other seasonal allergic rhinitis: Secondary | ICD-10-CM | POA: Diagnosis not present

## 2021-12-02 MED ORDER — CETIRIZINE HCL 1 MG/ML PO SOLN: 10.0000 mg | Freq: Every day | ORAL | 1 refills | Status: DC

## 2021-12-02 MED ORDER — OLOPATADINE HCL 0.1 % OP SOLN: 1.0000 [drp] | Freq: Two times a day (BID) | OPHTHALMIC | 1 refills | Status: DC

## 2021-12-02 MED ORDER — CETIRIZINE HCL 10 MG PO TABS: 10.0000 mg | ORAL_TABLET | Freq: Every day | ORAL | 1 refills | Status: DC

## 2021-12-02 NOTE — Discharge Instructions (Addendum)
I believe that your symptoms are related to allergies.  Please start cetirizine daily.  Take Pataday 1 drop in each eye twice daily.  You can use lubricating eyedrops/artificial tears such as Systane for additional symptom relief.  If you have any worsening symptoms please return for reevaluation.  If symptoms or not improving you may benefit from seeing an ophthalmologist; call to schedule appointment.

## 2021-12-02 NOTE — ED Provider Notes (Signed)
[facility]    CSN: [id number] Arrival date & time: 12/02/21  4403      History   Chief Complaint Chief Complaint  Patient presents with   Eye Redness    APPT 0900    HPI Emily Lucas is a 21 y.o. female.   Patient presents today with a 2-week history of bilateral eye redness and irritation.  She has been seen for similar symptoms in the past and diagnosed with bacterial conjunctivitis and prescribed ofloxacin.  Reports she has used this consistently but continues to have some symptoms.  She does not wear glasses or contacts.  Denies any visual disturbance, photophobia, headache, nausea, vomiting.  Denies any exposure to fine particulate matter or chemicals.  She does report some nasal congestion and occasional cough.  Denies formal diagnosis of allergies and has not taken any over-the-counter antihistamines.  She is able to perform daily activities despite symptoms.      Past Medical History:  Diagnosis Date   Gallstones    Learning disability    "comprehension"- mother reported    Patient Active Problem List   Diagnosis Date Noted   Acute blood loss anemia 01/26/2020   Postpartum care following vaginal delivery 8/13 01/25/2020   Maternal anemia, with delivery - IDA with superimposed ABL 01/25/2020   Normal labor 01/24/2020   Supervision of normal first teen pregnancy 01/24/2020   Chorioamnionitis in third trimester, fetus 1 01/24/2020   Abnormal cholangiogram    S/P laparoscopic cholecystectomy 01/25/2017    Past Surgical History:  Procedure Laterality Date   cholecystecomy  01/24/2017   CHOLECYSTECTOMY     ERCP N/A 02/01/2017   Procedure: ENDOSCOPIC RETROGRADE CHOLANGIOPANCREATOGRAPHY (ERCP);  Surgeon: Rachael Fee, MD;  Location: [facility] ENDOSCOPY;  Service: Endoscopy;  Laterality: N/A;    OB History     Gravida  1   Para  1   Term  1   Preterm  0   AB  0   Living  1      SAB  0   IAB  0   Ectopic  0   Multiple  0   Live  Births  1            Home Medications    Prior to Admission medications   Medication Sig Start Date End Date Taking? Authorizing Provider  cetirizine (ZYRTEC ALLERGY) 10 MG tablet Take 1 tablet (10 mg total) by mouth daily. 12/02/21  Yes Demmi Sindt K, PA-C  olopatadine (PATADAY) 0.1 % ophthalmic solution Place 1 drop into both eyes 2 (two) times daily. 12/02/21  Yes Jerris Keltz K, PA-C  Etonogestrel (NEXPLANON [place]) Inject into the skin.    [provider]  iron polysaccharides (FERREX 150) 150 MG capsule Take 1 capsule (150 mg total) by mouth daily. 01/27/20 04/30/20  June Leap, CNM    Family History Family History  Problem Relation Age of Onset   Healthy Mother    Healthy Father    Cancer Paternal Grandfather     Social History Social History   Tobacco Use   Smoking status: Never   Smokeless tobacco: Never  Vaping Use   Vaping Use: Never used  Substance Use Topics   Alcohol use: No    Alcohol/week: 0.0 standard drinks of alcohol   Drug use: No     Allergies   Motrin [ibuprofen]   Review of Systems Review of Systems  Constitutional:  Negative for activity change, appetite change, fatigue and fever.  HENT:  Positive for congestion and postnasal drip. Negative for sinus pressure, sneezing and sore throat.   Eyes:  Positive for redness and itching. Negative for photophobia, pain, discharge and visual disturbance.  Respiratory:  Negative for cough and shortness of breath.   Cardiovascular:  Negative for chest pain.  Gastrointestinal:  Negative for abdominal pain, diarrhea, nausea and vomiting.  Neurological:  Negative for dizziness, light-headedness and headaches.     Physical Exam Triage Vital Signs ED Triage Vitals  Enc Vitals Group     BP 12/02/21 0916 90/66     Pulse Rate 12/02/21 0916 89     Resp 12/02/21 0916 16     Temp 12/02/21 0916 98 F (36.7 C)     Temp Source 12/02/21 0916 Oral     SpO2 12/02/21 0916 96 %     Weight --       Height --      Head Circumference --      Peak Flow --      Pain Score 12/02/21 0920 0     Pain Loc --      Pain Edu? --      Excl. in [place]? --    No data found.  Updated Vital Signs BP 90/66 (BP Location: Right Arm)   Pulse 89   Temp 98 F (36.7 C) (Oral)   Resp 16   LMP 11/17/2021 (Approximate)   SpO2 96%   Breastfeeding No   Visual Acuity Right Eye Distance:   Left Eye Distance:   Bilateral Distance:    Right Eye Near:   Left Eye Near:    Bilateral Near:     Physical Exam Vitals reviewed.  Constitutional:      General: She is awake. She is not in acute distress.    Appearance: Normal appearance. She is well-developed. She is not ill-appearing.     Comments: Very pleasant female appears stated age in no acute distress sitting comfortably in exam room  HENT:     Head: Normocephalic and atraumatic.     Right Ear: Tympanic membrane, ear canal and external ear normal. Tympanic membrane is not erythematous or bulging.     Left Ear: Tympanic membrane, ear canal and external ear normal. Tympanic membrane is not erythematous or bulging.     Nose: Congestion present.     Right Sinus: No maxillary sinus tenderness or frontal sinus tenderness.     Left Sinus: No maxillary sinus tenderness or frontal sinus tenderness.     Mouth/Throat:     Pharynx: Uvula midline. No oropharyngeal exudate or posterior oropharyngeal erythema.  Eyes:     Extraocular Movements: Extraocular movements intact.     Conjunctiva/sclera:     Right eye: Right conjunctiva is injected.     Left eye: Left conjunctiva is injected.     Pupils: Pupils are equal, round, and reactive to light.  Cardiovascular:     Rate and Rhythm: Normal rate and regular rhythm.     Heart sounds: Normal heart sounds, S1 normal and S2 normal. No murmur heard. Pulmonary:     Effort: Pulmonary effort is normal.     Breath sounds: Normal breath sounds. No wheezing, rhonchi or rales.     Comments: Clear to auscultation  bilaterally Musculoskeletal:     Cervical back: Normal range of motion and neck supple.  Psychiatric:        Behavior: Behavior is cooperative.      UC Treatments / Results  Labs (all labs  ordered are listed, but only abnormal results are displayed) Labs Reviewed - No data to display  EKG   Radiology No results found.  Procedures Procedures (including critical care time)  Medications Ordered in UC Medications - No data to display  Initial Impression / Assessment and Plan / UC Course  I have reviewed the triage vital signs and the nursing notes.  Pertinent labs & imaging results that were available during my care of the patient were reviewed by me and considered in my medical decision making (see chart for details).     Given prolonged symptoms have not responded to topical antibiotics concern for allergic etiology.  We will start Pataday 1 drop twice daily if this helps provide additional relief.  She can use lubricating eyedrops/artificial tears for additional symptom relief.  We will also start cetirizine daily.  Recommended nasal rinses for additional symptom relief.  Discussed that if symptoms or not improving she should follow-up with ophthalmology and was given contact information for local provider with instruction to call to schedule an appointment.  If she has any worsening symptoms including significant eye drainage, ocular pain, visual disturbance, nausea/vomiting, cough, shortness of breath she needs to be seen immediately to which she expressed understanding.  Strict return precautions given.  Final Clinical Impressions(s) / UC Diagnoses   Final diagnoses:  Seasonal allergies  Allergic conjunctivitis of both eyes     Discharge Instructions      I believe that your symptoms are related to allergies.  Please start cetirizine daily.  Take Pataday 1 drop in each eye twice daily.  You can use lubricating eyedrops/artificial tears such as Systane for additional  symptom relief.  If you have any worsening symptoms please return for reevaluation.  If symptoms or not improving you may benefit from seeing an ophthalmologist; call to schedule appointment.     ED Prescriptions     Medication Sig Dispense Auth. Provider   cetirizine (ZYRTEC ALLERGY) 10 MG tablet Take 1 tablet (10 mg total) by mouth daily. 30 tablet Ruth Kovich K, PA-C   olopatadine (PATADAY) 0.1 % ophthalmic solution Place 1 drop into both eyes 2 (two) times daily. 5 mL Shanan Fitzpatrick K, PA-C      PDMP not reviewed this encounter.   Jeani Hawking, PA-C 12/02/21 0814

## 2021-12-02 NOTE — ED Triage Notes (Signed)
Patient c/o bilateral eye redness x 2 weeks.   Patient endorses nasal congestion. Patient endorses sneezing.   Patient endorses eye itching.   Patient denies vision changes.   Patient was previously seen at this clinic for similar complaint and given ofloxacin with some relief of symptoms. Patient is requesting refill.

## 2021-12-11 ENCOUNTER — Ambulatory Visit (HOSPITAL_COMMUNITY)
Admission: RE | Admit: 2021-12-11 | Discharge: 2021-12-11 | Disposition: A | Discharge status: Home / Self Care | Payer: Medicaid Other | Source: Ambulatory Visit | Attending: Student | Admitting: Student

## 2021-12-11 ENCOUNTER — Encounter (HOSPITAL_COMMUNITY): Payer: Self-pay

## 2021-12-11 VITALS — BP 106/68 | HR 73 | Temp 98.3°F | Resp 17

## 2021-12-11 DIAGNOSIS — H01001 Unspecified blepharitis right upper eyelid: Secondary | ICD-10-CM | POA: Diagnosis not present

## 2021-12-11 MED ORDER — MOXIFLOXACIN HCL 0.5 % OP SOLN: 1.0000 [drp] | Freq: Three times a day (TID) | OPHTHALMIC | 0 refills | Status: AC

## 2021-12-11 NOTE — ED Triage Notes (Signed)
Pt having right eye swelling that started today. Had some drainage from it.

## 2021-12-11 NOTE — ED Provider Notes (Signed)
[facility]    CSN: [id number] Arrival date & time: 12/11/21  1824      History   Chief Complaint Chief Complaint  Patient presents with   Facial Swelling    HPI Emily Lucas is a 21 y.o. female presenting with right upper lid swelling for 1 day.  History recurrent eye infections for the last year, she has not seen an ophthalmologist for this yet.  States discomfort, redness.  She states that she has thrown out her eye products after every eye infection, she does not wear contacts or glasses.  She does not wear any daily eye make-up or false lashes.  Denies photophobia, foreign body sensation, eye crusting in the morning, eye pain, eye pain with movement, injury to eye, vision changes, double vision, excessive tearing, burning eyes   HPI  Past Medical History:  Diagnosis Date   Gallstones    Learning disability    "comprehension"- mother reported    Patient Active Problem List   Diagnosis Date Noted   Acute blood loss anemia 01/26/2020   Postpartum care following vaginal delivery 8/13 01/25/2020   Maternal anemia, with delivery - IDA with superimposed ABL 01/25/2020   Normal labor 01/24/2020   Supervision of normal first teen pregnancy 01/24/2020   Chorioamnionitis in third trimester, fetus 1 01/24/2020   Abnormal cholangiogram    S/P laparoscopic cholecystectomy 01/25/2017    Past Surgical History:  Procedure Laterality Date   cholecystecomy  01/24/2017   CHOLECYSTECTOMY     ERCP N/A 02/01/2017   Procedure: ENDOSCOPIC RETROGRADE CHOLANGIOPANCREATOGRAPHY (ERCP);  Surgeon: Rachael Fee, MD;  Location: [facility] ENDOSCOPY;  Service: Endoscopy;  Laterality: N/A;    OB History     Gravida  1   Para  1   Term  1   Preterm  0   AB  0   Living  1      SAB  0   IAB  0   Ectopic  0   Multiple  0   Live Births  1            Home Medications    Prior to Admission medications   Medication Sig Start Date End Date Taking? Authorizing  Provider  moxifloxacin (VIGAMOX) 0.5 % ophthalmic solution Place 1 drop into the right eye 3 (three) times daily for 7 days. 12/11/21 12/18/21 Yes Rhys Martini, PA-C  cetirizine HCl (ZYRTEC) 1 MG/ML solution Take 10 mLs (10 mg total) by mouth daily. 12/02/21   Raspet, Noberto Retort, PA-C  Etonogestrel (NEXPLANON [place]) Inject into the skin.    [provider]  olopatadine (PATADAY) 0.1 % ophthalmic solution Place 1 drop into both eyes 2 (two) times daily. 12/02/21   Raspet, Noberto Retort, PA-C  iron polysaccharides (FERREX 150) 150 MG capsule Take 1 capsule (150 mg total) by mouth daily. 01/27/20 04/30/20  June Leap, CNM    Family History Family History  Problem Relation Age of Onset   Healthy Mother    Healthy Father    Cancer Paternal Grandfather     Social History Social History   Tobacco Use   Smoking status: Never   Smokeless tobacco: Never  Vaping Use   Vaping Use: Never used  Substance Use Topics   Alcohol use: No    Alcohol/week: 0.0 standard drinks of alcohol   Drug use: No     Allergies   Motrin [ibuprofen]   Review of Systems Review of Systems  Eyes:  Positive for discharge and redness.  All other systems reviewed and are negative.    Physical Exam Triage Vital Signs ED Triage Vitals  Enc Vitals Group     BP 12/11/21 1859 106/68     Pulse Rate 12/11/21 1859 73     Resp 12/11/21 1859 17     Temp 12/11/21 1859 98.3 F (36.8 C)     Temp Source 12/11/21 1859 Oral     SpO2 12/11/21 1859 98 %     Weight --      Height --      Head Circumference --      Peak Flow --      Pain Score 12/11/21 1858 0     Pain Loc --      Pain Edu? --      Excl. in GC? --    No data found.  Updated Vital Signs BP 106/68 (BP Location: Right Arm)   Pulse 73   Temp 98.3 F (36.8 C) (Oral)   Resp 17   LMP 11/17/2021 (Approximate)   SpO2 98%   Visual Acuity Right Eye Distance:   Left Eye Distance:   Bilateral Distance:    Right Eye Near:   Left Eye Near:     Bilateral Near:     Physical Exam Vitals reviewed.  Constitutional:      Appearance: Normal appearance.  HENT:     Head: Normocephalic and atraumatic.     Right Ear: Tympanic membrane, ear canal and external ear normal. There is no impacted cerumen.     Left Ear: Tympanic membrane, ear canal and external ear normal. There is no impacted cerumen.     Nose: Nose normal. No congestion.     Mouth/Throat:     Pharynx: Oropharynx is clear. No posterior oropharyngeal erythema.  Eyes:     General: Lids are normal. Lids are everted, no foreign bodies appreciated. Vision grossly intact. Gaze aligned appropriately. No visual field deficit.       Right eye: No foreign body, discharge or hordeolum.        Left eye: No foreign body, discharge or hordeolum.     Extraocular Movements: Extraocular movements intact.     Right eye: Normal extraocular motion and no nystagmus.     Left eye: Normal extraocular motion and no nystagmus.     Conjunctiva/sclera:     Right eye: Right conjunctiva is injected. No chemosis, exudate or hemorrhage.    Left eye: Left conjunctiva is not injected. No chemosis, exudate or hemorrhage.    Pupils: Pupils are equal, round, and reactive to light.     Visual Fields: Right eye visual fields normal and left eye visual fields normal.     Comments: R conjunctival injection. The upper eyelid margin is uniformly swollen. The entire lid is not swollen, and there is no proptosis. PERRLA, EOMI without pain. No orbital tenderness. Visual acuity grossly intact.   Cardiovascular:     Rate and Rhythm: Normal rate and regular rhythm.     Heart sounds: Normal heart sounds.  Pulmonary:     Effort: Pulmonary effort is normal.     Breath sounds: Normal breath sounds.  Neurological:     General: No focal deficit present.     Mental Status: She is alert.  Psychiatric:        Mood and Affect: Mood normal.        Behavior: Behavior normal.        Thought Content: Thought content  normal.         Judgment: Judgment normal.      UC Treatments / Results  Labs (all labs ordered are listed, but only abnormal results are displayed) Labs Reviewed - No data to display  EKG   Radiology No results found.  Procedures Procedures (including critical care time)  Medications Ordered in UC Medications - No data to display  Initial Impression / Assessment and Plan / UC Course  I have reviewed the triage vital signs and the nursing notes.  Pertinent labs & imaging results that were available during my care of the patient were reviewed by me and considered in my medical decision making (see chart for details).     This patient is a very pleasant 21 y.o. year old female presenting with blepharitis. Visual acuity grossly intact. Does not wear contacts or glasses. Multiple similar eye infections in the last year. Moxifloxacin sent. Warm compresses. Avoid eye makeup and eye products during treatment.  I attempted to send an ophthalmology referral, but we discussed that I cannot send a true referral through urgent care.  She should follow-up with her primary care provider if this referral does not work. She is in agreement.   Final Clinical Impressions(s) / UC Diagnoses   Final diagnoses:  Blepharitis of right upper eyelid, unspecified type     Discharge Instructions      -You have bacterial conjunctivitis (pinkeye). -Start the antibiotic drops, Vigamox 1 drop into the affected eye/s every 8 hours while awake for 7 days. -You can also try warm compresses or washing the face with gentle soap and water to remove crust in the morning. -Your symptoms should be a lot better in about 1 day, and you'll also no longer be contagious after 1 day on the antibiotic. -If your symptoms are getting worse instead of better, like pain, discharge, itching, vision changes-follow-up with your eye doctor as soon as possible. -Do not wear contact lenses until you have completed treatment (7 days!).  Wear glasses only.  -Throw away and replace any eye or face makeup you used while infected.    ED Prescriptions     Medication Sig Dispense Auth. Provider   moxifloxacin (VIGAMOX) 0.5 % ophthalmic solution Place 1 drop into the right eye 3 (three) times daily for 7 days. 3 mL Rhys Martini, PA-C      PDMP not reviewed this encounter.   Rhys Martini, PA-C 12/11/21 1949

## 2021-12-11 NOTE — Discharge Instructions (Addendum)
-  You have bacterial conjunctivitis (pinkeye). -Start the antibiotic drops, Vigamox 1 drop into the affected eye/s every 8 hours while awake for 7 days. -You can also try warm compresses or washing the face with gentle soap and water to remove crust in the morning. -Your symptoms should be a lot better in about 1 day, and you'll also no longer be contagious after 1 day on the antibiotic. -If your symptoms are getting worse instead of better, like pain, discharge, itching, vision changes-follow-up with your eye doctor as soon as possible. -Do not wear contact lenses until you have completed treatment (7 days!). Wear glasses only.  -Throw away and replace any eye or face makeup you used while infected.

## 2021-12-12 ENCOUNTER — Ambulatory Visit (HOSPITAL_COMMUNITY): Payer: Medicaid Other

## 2021-12-26 ENCOUNTER — Ambulatory Visit (HOSPITAL_COMMUNITY): Payer: Medicaid Other

## 2022-01-27 ENCOUNTER — Encounter (HOSPITAL_COMMUNITY): Payer: Self-pay

## 2022-01-27 ENCOUNTER — Ambulatory Visit (HOSPITAL_COMMUNITY)
Admission: RE | Admit: 2022-01-27 | Discharge: 2022-01-27 | Disposition: A | Discharge status: Home / Self Care | Payer: Medicaid Other | Source: Ambulatory Visit | Attending: Emergency Medicine | Admitting: Emergency Medicine

## 2022-01-27 VITALS — BP 94/63 | HR 77 | Temp 97.9°F | Resp 16

## 2022-01-27 DIAGNOSIS — Z3202 Encounter for pregnancy test, result negative: Secondary | ICD-10-CM

## 2022-01-27 DIAGNOSIS — B350 Tinea barbae and tinea capitis: Secondary | ICD-10-CM | POA: Diagnosis not present

## 2022-01-27 DIAGNOSIS — L219 Seborrheic dermatitis, unspecified: Secondary | ICD-10-CM

## 2022-01-27 LAB — POC URINE PREG, ED: Preg Test, Ur: NEGATIVE

## 2022-01-27 MED ORDER — FLUCONAZOLE 10 MG/ML PO SUSR: ORAL | 0 refills | Status: DC

## 2022-01-27 MED ORDER — KETOCONAZOLE 2 % EX SHAM: 1.0000 | MEDICATED_SHAMPOO | CUTANEOUS | 0 refills | Status: DC

## 2022-01-27 NOTE — ED Provider Notes (Addendum)
[facility]    CSN: [id number] Arrival date & time: 01/27/22  0848      History   Chief Complaint Chief Complaint  Patient presents with   Rash   APPT 0900    HPI Emily Lucas is a 21 y.o. female.  Presents with 1 month history of scalp rash.  Reports she has been noticing scaling and yellow crusting of her scalp with intense itching.  Noticed it began after she went to a new hairdresser.  It is not painful.  She has tried head and shoulders shampoo and applying oil to the scalp without relief.  Reports some scaling behind the ears as well.  Denies any rash on the body  LMP 7/15  Past Medical History:  Diagnosis Date   Gallstones    Learning disability    "comprehension"- mother reported    Patient Active Problem List   Diagnosis Date Noted   Acute blood loss anemia 01/26/2020   Postpartum care following vaginal delivery 8/13 01/25/2020   Maternal anemia, with delivery - IDA with superimposed ABL 01/25/2020   Normal labor 01/24/2020   Supervision of normal first teen pregnancy 01/24/2020   Chorioamnionitis in third trimester, fetus 1 01/24/2020   Abnormal cholangiogram    S/P laparoscopic cholecystectomy 01/25/2017    Past Surgical History:  Procedure Laterality Date   cholecystecomy  01/24/2017   CHOLECYSTECTOMY     ERCP N/A 02/01/2017   Procedure: ENDOSCOPIC RETROGRADE CHOLANGIOPANCREATOGRAPHY (ERCP);  Surgeon: Rachael Fee, MD;  Location: [facility] ENDOSCOPY;  Service: Endoscopy;  Laterality: N/A;    OB History     Gravida  1   Para  1   Term  1   Preterm  0   AB  0   Living  1      SAB  0   IAB  0   Ectopic  0   Multiple  0   Live Births  1            Home Medications    Prior to Admission medications   Medication Sig Start Date End Date Taking? Authorizing Provider  fluconazole (DIFLUCAN) 10 MG/ML suspension Take 10 mL once weekly for 3 weeks 01/27/22  Yes Ticara Waner, PA-C  ketoconazole (NIZORAL) 2 % shampoo  Apply 1 Application topically 2 (two) times a week. 01/28/22  Yes Latorie Montesano, PA-C  Etonogestrel (NEXPLANON [place]) Inject into the skin.    [provider]  iron polysaccharides (FERREX 150) 150 MG capsule Take 1 capsule (150 mg total) by mouth daily. 01/27/20 04/30/20  June Leap, CNM    Family History Family History  Problem Relation Age of Onset   Healthy Mother    Healthy Father    Cancer Paternal Grandfather     Social History Social History   Tobacco Use   Smoking status: Never   Smokeless tobacco: Never  Vaping Use   Vaping Use: Never used  Substance Use Topics   Alcohol use: No    Alcohol/week: 0.0 standard drinks of alcohol   Drug use: No     Allergies   Motrin [ibuprofen]   Review of Systems Review of Systems Per HPI  Physical Exam Triage Vital Signs ED Triage Vitals  Enc Vitals Group     BP 01/27/22 0900 94/63     Pulse Rate 01/27/22 0900 77     Resp 01/27/22 0900 16     Temp 01/27/22 0900 97.9 F (36.6 C)  Temp Source 01/27/22 0900 Oral     SpO2 01/27/22 0900 98 %     Weight --      Height --      Head Circumference --      Peak Flow --      Pain Score 01/27/22 0905 0     Pain Loc --      Pain Edu? --      Excl. in [place]? --    No data found.  Updated Vital Signs BP 94/63 (BP Location: Left Arm)   Pulse 77   Temp 97.9 F (36.6 C) (Oral)   Resp 16   LMP 12/26/2021 (Exact Date)   SpO2 98%    Physical Exam Vitals and nursing note reviewed.  Constitutional:      Appearance: Normal appearance.  HENT:     Head:     Comments: Scalp with large areas of scaling, crusting, dandruff and yellow material.  Appears excoriated    Mouth/Throat:     Pharynx: Oropharynx is clear.  Eyes:     Conjunctiva/sclera: Conjunctivae normal.  Cardiovascular:     Rate and Rhythm: Normal rate and regular rhythm.     Pulses: Normal pulses.     Heart sounds: Normal heart sounds.  Pulmonary:     Effort: Pulmonary effort is normal.      Breath sounds: Normal breath sounds.  Skin:    Findings: Rash present. Rash is scaling.     Comments: scalp  Neurological:     Mental Status: She is alert and oriented to person, place, and time.      UC Treatments / Results  Labs (all labs ordered are listed, but only abnormal results are displayed) Labs Reviewed  POC URINE PREG, ED    EKG  Radiology No results found.  Procedures Procedures (including critical care time)  Medications Ordered in UC Medications - No data to display  Initial Impression / Assessment and Plan / UC Course  I have reviewed the triage vital signs and the nursing notes.  Pertinent labs & imaging results that were available during my care of the patient were reviewed by me and considered in my medical decision making (see chart for details).  Urine pregnancy negative - test ordered in order to give oral fluconazole treatment safely.  Appears fungal.  Recommend ketoconazole shampoo twice weekly for the next 4 to 5 weeks.  Recommend to leave in for about 5 minutes before rinsing.  Additionally we will try fluconazole once weekly for the next 3 weeks.  Patient reports she cannot take pills and is requesting the liquid.  Follow-up with primary care as needed, can return to urgent care as needed as well.  Patient agrees to plan  Final Clinical Impressions(s) / UC Diagnoses   Final diagnoses:  Tinea capitis  Seborrheic dermatitis of scalp     Discharge Instructions      Use the shampoo twice weekly. Take the fluconazole by mouth once weekly for the next 3 weeks.  Follow up with your primary care provider or return to the urgent care with any concerns.     ED Prescriptions     Medication Sig Dispense Auth. Provider   ketoconazole (NIZORAL) 2 % shampoo Apply 1 Application topically 2 (two) times a week. 120 mL Jorah Hua, PA-C   fluconazole (DIFLUCAN) 10 MG/ML suspension Take 10 mL once weekly for 3 weeks 35 mL Kamran Coker, Wells Guiles, PA-C       PDMP not reviewed this encounter.  Tekelia Kareem, [place] 01/27/22 1003    Keith Felten, Lurena Joiner, [place] 02/01/22 1023

## 2022-01-27 NOTE — Discharge Instructions (Addendum)
Use the shampoo twice weekly. Take the fluconazole by mouth once weekly for the next 3 weeks.  Follow up with your primary care provider or return to the urgent care with any concerns.

## 2022-01-27 NOTE — ED Triage Notes (Signed)
Patient c/o Rash x 1 month.   Patient endorses rash has progressively worsened.   Patient has rash present on scalp with "yellow crusted stuff" on scalp.   Patient endorses itching. Patient endorses onset of symptoms began after going to a new hairdresser.   Patient denies pain.   Patient has tried shampooing hair and oiling scalp with no relief of symptoms.

## 2022-02-03 ENCOUNTER — Ambulatory Visit (HOSPITAL_COMMUNITY)
Admission: EM | Admit: 2022-02-03 | Discharge: 2022-02-03 | Disposition: A | Discharge status: Home / Self Care | Payer: Medicaid Other | Attending: Physician Assistant | Admitting: Physician Assistant

## 2022-02-03 ENCOUNTER — Encounter (HOSPITAL_COMMUNITY): Payer: Self-pay | Admitting: *Deleted

## 2022-02-03 ENCOUNTER — Other Ambulatory Visit: Payer: Self-pay

## 2022-02-03 DIAGNOSIS — L219 Seborrheic dermatitis, unspecified: Secondary | ICD-10-CM

## 2022-02-03 DIAGNOSIS — B350 Tinea barbae and tinea capitis: Secondary | ICD-10-CM | POA: Diagnosis not present

## 2022-02-03 DIAGNOSIS — R21 Rash and other nonspecific skin eruption: Secondary | ICD-10-CM

## 2022-02-03 MED ORDER — GRISEOFULVIN MICROSIZE 125 MG/5ML PO SUSP
500.0000 mg | Freq: Every day | ORAL | 1 refills | Status: DC
Start: 2022-02-03 — End: 2022-03-22

## 2022-02-03 MED ORDER — KETOCONAZOLE 2 % EX SHAM: 1.0000 | MEDICATED_SHAMPOO | CUTANEOUS | 1 refills | Status: DC

## 2022-02-03 MED ORDER — DOXYCYCLINE HYCLATE 100 MG PO CAPS: 100.0000 mg | ORAL_CAPSULE | Freq: Two times a day (BID) | ORAL | 0 refills | Status: DC

## 2022-02-03 NOTE — Discharge Instructions (Signed)
Advised to use the ketoconazole shampoo 3 times a week, lather up and leave on for 10 minutes, then wash off.  This needs to be continued for 3 weeks. Advised to use the griseofulvin, 4 teaspoons daily.  This should be continued for at least 6 weeks. Advised to to take the doxycycline 100 mg twice daily to treat superficial scalp infection that is caused by the tinea capitis. If there is no improvement over the next 3 to 4 weeks with this combination then advised to consider dermatology referral.

## 2022-02-03 NOTE — ED Triage Notes (Signed)
Pt returns today because of on going yeast infection on scalpe. Pt reports last shampoo has not worked.

## 2022-02-03 NOTE — ED Provider Notes (Signed)
[facility]    CSN: [id number] Arrival date & time: 02/03/22  4650      History   Chief Complaint Chief Complaint  Patient presents with   Rash    HPI Emily Lucas is a 21 y.o. female.   21 year old female presents with scalp rash.  Patient indicates that ever since middle of July when she had her hair done and had braids put and she started developing a scalp rash which became flaky and then continued developed into a pustular and scaly infection which was all throughout her scalp.  Patient indicates that she did use the ketoconazole shampoo which was twice a week over the past couple weeks and it does not given her any relief.  She returns today with continued problems from the scalp rash, itching, pustular development and drainage.  She indicates these areas are somewhat tender, she has not had any fever or chills.  She relates the condition is more irritative and she just cannot seem to clear the scaling out of her hair when she washes it.  She believes that she contracted the scalp rash when she had her braids done at the hairstylist.   Rash   Past Medical History:  Diagnosis Date   Gallstones    Learning disability    "comprehension"- mother reported    Patient Active Problem List   Diagnosis Date Noted   Acute blood loss anemia 01/26/2020   Postpartum care following vaginal delivery 8/13 01/25/2020   Maternal anemia, with delivery - IDA with superimposed ABL 01/25/2020   Normal labor 01/24/2020   Supervision of normal first teen pregnancy 01/24/2020   Chorioamnionitis in third trimester, fetus 1 01/24/2020   Abnormal cholangiogram    S/P laparoscopic cholecystectomy 01/25/2017    Past Surgical History:  Procedure Laterality Date   cholecystecomy  01/24/2017   CHOLECYSTECTOMY     ERCP N/A 02/01/2017   Procedure: ENDOSCOPIC RETROGRADE CHOLANGIOPANCREATOGRAPHY (ERCP);  Surgeon: Rachael Fee, MD;  Location: [facility] ENDOSCOPY;  Service: Endoscopy;   Laterality: N/A;    OB History     Gravida  1   Para  1   Term  1   Preterm  0   AB  0   Living  1      SAB  0   IAB  0   Ectopic  0   Multiple  0   Live Births  1            Home Medications    Prior to Admission medications   Medication Sig Start Date End Date Taking? Authorizing Provider  doxycycline (VIBRAMYCIN) 100 MG capsule Take 1 capsule (100 mg total) by mouth 2 (two) times daily. 02/03/22  Yes Ellsworth Lennox, PA-C  griseofulvin microsize (GRIFULVIN V) 125 MG/5ML suspension Take 20 mLs (500 mg total) by mouth daily. 02/03/22  Yes Ellsworth Lennox, PA-C  Etonogestrel [facility]) Inject into the skin.    [provider]  fluconazole (DIFLUCAN) 10 MG/ML suspension Take 10 mL once weekly for 3 weeks 01/27/22   Rising, Lurena Joiner, PA-C  ketoconazole (NIZORAL) 2 % shampoo Apply 1 Application topically 2 (two) times a week. 02/04/22   Ellsworth Lennox, PA-C  iron polysaccharides (FERREX 150) 150 MG capsule Take 1 capsule (150 mg total) by mouth daily. 01/27/20 04/30/20  June Leap, CNM    Family History Family History  Problem Relation Age of Onset   Healthy Mother    Healthy Father    Cancer Paternal  Grandfather     Social History Social History   Tobacco Use   Smoking status: Never   Smokeless tobacco: Never  Vaping Use   Vaping Use: Never used  Substance Use Topics   Alcohol use: No    Alcohol/week: 0.0 standard drinks of alcohol   Drug use: No     Allergies   Motrin [ibuprofen]   Review of Systems Review of Systems  Skin:  Positive for rash (scalp borders and within scalp).     Physical Exam Triage Vital Signs ED Triage Vitals  Enc Vitals Group     BP 02/03/22 0858 102/79     Pulse Rate 02/03/22 0858 62     Resp 02/03/22 0858 18     Temp 02/03/22 0858 98 F (36.7 C)     Temp src --      SpO2 02/03/22 0858 100 %     Weight --      Height --      Head Circumference --      Peak Flow --      Pain Score 02/03/22 0859 0      Pain Loc --      Pain Edu? --      Excl. in GC? --    No data found.  Updated Vital Signs BP 102/79   Pulse 62   Temp 98 F (36.7 C)   Resp 18   LMP 12/26/2021 (Exact Date)   SpO2 100%   Visual Acuity Right Eye Distance:   Left Eye Distance:   Bilateral Distance:    Right Eye Near:   Left Eye Near:    Bilateral Near:     Physical Exam Constitutional:      Appearance: Normal appearance.  Skin:    Comments: Scalp: There is ever evidence of scaling seborrheic rash on the borders of the scalp anterior and posteriorly.  There is also evidence of pustular tinea development within the inside of the scalp along the frontal top and posterior portions of the scalp involving the hair follicles and surrounding skin, these areas are scaling pustular scabbing with different degrees of development and has mild drainage associated.  There is mild redness present also.  Neurological:     Mental Status: She is alert.      UC Treatments / Results  Labs (all labs ordered are listed, but only abnormal results are displayed) Labs Reviewed - No data to display  EKG   Radiology No results found.  Procedures Procedures (including critical care time)  Medications Ordered in UC Medications - No data to display  Initial Impression / Assessment and Plan / UC Course  I have reviewed the triage vital signs and the nursing notes.  Pertinent labs & imaging results that were available during my care of the patient were reviewed by me and considered in my medical decision making (see chart for details).    Plan: 1.  Advised to increase the ketoconazole shampoo 2-3 times a week, lather up and leave on 10 minutes, then wash off. 2.  Advised to take the griseofulvin solution, 500 mg daily for the next 6 to 8 weeks to see if this will improve the condition. 3.  Advised to take the doxycycline 100 mg twice daily until completed to reduce any infectious component that is combined with the tinea  capitis. 4.  Advised to return to urgent care if symptoms fail to improve and at that time consider dermatology consult. Final Clinical Impressions(s) / UC  Diagnoses   Final diagnoses:  Rash  Tinea capitis  Seborrheic dermatitis     Discharge Instructions      Advised to use the ketoconazole shampoo 3 times a week, lather up and leave on for 10 minutes, then wash off.  This needs to be continued for 3 weeks. Advised to use the griseofulvin, 4 teaspoons daily.  This should be continued for at least 6 weeks. Advised to to take the doxycycline 100 mg twice daily to treat superficial scalp infection that is caused by the tinea capitis. If there is no improvement over the next 3 to 4 weeks with this combination then advised to consider dermatology referral.    ED Prescriptions     Medication Sig Dispense Auth. Provider   ketoconazole (NIZORAL) 2 % shampoo Apply 1 Application topically 2 (two) times a week. 120 mL Ellsworth Lennox, PA-C   griseofulvin microsize (GRIFULVIN V) 125 MG/5ML suspension Take 20 mLs (500 mg total) by mouth daily. 300 mL Ellsworth Lennox, PA-C   doxycycline (VIBRAMYCIN) 100 MG capsule Take 1 capsule (100 mg total) by mouth 2 (two) times daily. 20 capsule Ellsworth Lennox, PA-C      PDMP not reviewed this encounter.   Ellsworth Lennox, PA-C 02/03/22 [phone]

## 2022-02-23 ENCOUNTER — Ambulatory Visit (HOSPITAL_COMMUNITY): Payer: Medicaid Other

## 2022-02-24 ENCOUNTER — Other Ambulatory Visit: Payer: Self-pay

## 2022-02-24 ENCOUNTER — Encounter (HOSPITAL_COMMUNITY): Payer: Self-pay | Admitting: Emergency Medicine

## 2022-02-24 ENCOUNTER — Ambulatory Visit (HOSPITAL_COMMUNITY): Payer: Medicaid Other

## 2022-02-24 ENCOUNTER — Ambulatory Visit (HOSPITAL_COMMUNITY)
Admission: EM | Admit: 2022-02-24 | Discharge: 2022-02-24 | Disposition: A | Discharge status: Home / Self Care | Payer: Medicaid Other | Attending: Physician Assistant | Admitting: Physician Assistant

## 2022-02-24 DIAGNOSIS — H1032 Unspecified acute conjunctivitis, left eye: Secondary | ICD-10-CM | POA: Diagnosis not present

## 2022-02-24 DIAGNOSIS — L240 Irritant contact dermatitis due to detergents: Secondary | ICD-10-CM

## 2022-02-24 MED ORDER — PREDNISONE 5 MG/ML PO CONC: 20.0000 mg | Freq: Every day | ORAL | 0 refills | Status: AC

## 2022-02-24 MED ORDER — POLYMYXIN B-TRIMETHOPRIM 10000-0.1 UNIT/ML-% OP SOLN: 1.0000 [drp] | OPHTHALMIC | 0 refills | Status: AC

## 2022-02-24 NOTE — ED Provider Notes (Signed)
[facility]    CSN: [id number] Arrival date & time: 02/24/22  0901      History   Chief Complaint Chief Complaint  Patient presents with   Eye Problem    HPI Emily Lucas is a 21 y.o. female.   Patient here today for evaluation of left eye discomfort and redness that she first noticed a few days ago. She reports that she has had tearing from her eye and crusting in the mornings. She has not had any significant pain or discomfort to her eye, denies any FB sensation. She denies any injury to her eye. She has not had vision changes. She has been using previously prescribed eye drop without resolution.   She reports she has a rash that started after she used a new laundry detergent. Rash is present to neck and torso and is itchy. She does not report any shortness of breath or other symptoms. She has tried OTC treatment without resolution.   The history is provided by the patient.    Past Medical History:  Diagnosis Date   Gallstones    Learning disability    "comprehension"- mother reported    Patient Active Problem List   Diagnosis Date Noted   Acute blood loss anemia 01/26/2020   Postpartum care following vaginal delivery 8/13 01/25/2020   Maternal anemia, with delivery - IDA with superimposed ABL 01/25/2020   Normal labor 01/24/2020   Supervision of normal first teen pregnancy 01/24/2020   Chorioamnionitis in third trimester, fetus 1 01/24/2020   Abnormal cholangiogram    S/P laparoscopic cholecystectomy 01/25/2017    Past Surgical History:  Procedure Laterality Date   cholecystecomy  01/24/2017   CHOLECYSTECTOMY     ERCP N/A 02/01/2017   Procedure: ENDOSCOPIC RETROGRADE CHOLANGIOPANCREATOGRAPHY (ERCP);  Surgeon: Rachael Fee, MD;  Location: [facility] ENDOSCOPY;  Service: Endoscopy;  Laterality: N/A;    OB History     Gravida  1   Para  1   Term  1   Preterm  0   AB  0   Living  1      SAB  0   IAB  0   Ectopic  0   Multiple  0    Live Births  1            Home Medications    Prior to Admission medications   Medication Sig Start Date End Date Taking? Authorizing Provider  predniSONE 5 MG/ML concentrated solution Take 4 mLs (20 mg total) by mouth daily with breakfast for 5 days. 02/24/22 03/01/22 Yes Tomi Bamberger, PA-C  trimethoprim-polymyxin b (POLYTRIM) ophthalmic solution Place 1 drop into the left eye every 4 (four) hours for 7 days. 02/24/22 03/03/22 Yes Tomi Bamberger, PA-C  doxycycline (VIBRAMYCIN) 100 MG capsule Take 1 capsule (100 mg total) by mouth 2 (two) times daily. Patient not taking: Reported on 02/24/2022 02/03/22   Ellsworth Lennox, PA-C  Etonogestrel [facility]) Inject into the skin.    [provider]  fluconazole (DIFLUCAN) 10 MG/ML suspension Take 10 mL once weekly for 3 weeks Patient not taking: Reported on 02/24/2022 01/27/22   Rising, Lurena Joiner, PA-C  griseofulvin microsize (GRIFULVIN V) 125 MG/5ML suspension Take 20 mLs (500 mg total) by mouth daily. Patient not taking: Reported on 02/24/2022 02/03/22   Ellsworth Lennox, PA-C  ketoconazole (NIZORAL) 2 % shampoo Apply 1 Application topically 2 (two) times a week. Patient not taking: Reported on 02/24/2022 02/04/22   Ellsworth Lennox, PA-C  iron polysaccharides (FERREX 150) 150 MG capsule Take 1 capsule (150 mg total) by mouth daily. 01/27/20 04/30/20  June Leap, CNM    Family History Family History  Problem Relation Age of Onset   Healthy Mother    Healthy Father    Cancer Paternal Grandfather     Social History Social History   Tobacco Use   Smoking status: Never   Smokeless tobacco: Never  Vaping Use   Vaping Use: Never used  Substance Use Topics   Alcohol use: No    Alcohol/week: 0.0 standard drinks of alcohol   Drug use: No     Allergies   Motrin [ibuprofen]   Review of Systems Review of Systems  Constitutional:  Negative for chills and fever.  Eyes:  Positive for discharge and redness. Negative for pain, itching  and visual disturbance.  Respiratory:  Negative for shortness of breath.   Skin:  Positive for rash.     Physical Exam Triage Vital Signs ED Triage Vitals  Enc Vitals Group     BP      Pulse      Resp      Temp      Temp src      SpO2      Weight      Height      Head Circumference      Peak Flow      Pain Score      Pain Loc      Pain Edu?      Excl. in GC?    No data found.  Updated Vital Signs BP 111/77 (BP Location: Left Arm)   Pulse 72   Temp 98.2 F (36.8 C) (Oral)   Resp 18   LMP 12/26/2021 (Exact Date)   SpO2 96%      Physical Exam Vitals and nursing note reviewed.  Constitutional:      General: She is not in acute distress.    Appearance: Normal appearance. She is not ill-appearing.  HENT:     Head: Normocephalic and atraumatic.  Eyes:     Extraocular Movements: Extraocular movements intact.     Pupils: Pupils are equal, round, and reactive to light.     Comments: Left conjunctiva injected, right conjunctiva WNL  Cardiovascular:     Rate and Rhythm: Normal rate.  Pulmonary:     Effort: Pulmonary effort is normal.  Skin:    Findings: Rash (mildly erythematous papular eruption to neck, trunk) present.  Neurological:     Mental Status: She is alert.  Psychiatric:        Mood and Affect: Mood normal.        Behavior: Behavior normal.        Thought Content: Thought content normal.      UC Treatments / Results  Labs (all labs ordered are listed, but only abnormal results are displayed) Labs Reviewed - No data to display  EKG   Radiology No results found.  Procedures Procedures (including critical care time)  Medications Ordered in UC Medications - No data to display  Initial Impression / Assessment and Plan / UC Course  I have reviewed the triage vital signs and the nursing notes.  Pertinent labs & imaging results that were available during my care of the patient were reviewed by me and considered in my medical decision making  (see chart for details).    Will treat to cover contact dermatitis with oral steroid (patient reports she cannot take  pills). Ok to take benadryl and use topical hydrocortisone cream if needed. Eye drops prescribed for treatment of conjunctivitis. Encouraged follow up with any further concerns.   Final Clinical Impressions(s) / UC Diagnoses   Final diagnoses:  Contact dermatitis due to detergent, unspecified contact dermatitis type  Acute conjunctivitis of left eye, unspecified acute conjunctivitis type   Discharge Instructions   None    ED Prescriptions     Medication Sig Dispense Auth. Provider   trimethoprim-polymyxin b (POLYTRIM) ophthalmic solution Place 1 drop into the left eye every 4 (four) hours for 7 days. 10 mL Tomi Bamberger, PA-C   predniSONE 5 MG/ML concentrated solution Take 4 mLs (20 mg total) by mouth daily with breakfast for 5 days. 20 mL Tomi Bamberger, PA-C      PDMP not reviewed this encounter.   Tomi Bamberger, PA-C 02/24/22 1124

## 2022-02-24 NOTE — ED Triage Notes (Addendum)
Using a different laundry detergent/softener starting 2 days ago.  Patient reports a rash to left neck and chest and torso that itches and left eye is red and matted when first waking up.   Olopatadine or something similar for eye has been used-this was an eyedrop provided at a prior visit.  Vision is not blurry, but is watery with sunlight

## 2022-03-11 ENCOUNTER — Ambulatory Visit (HOSPITAL_COMMUNITY): Payer: Medicaid Other

## 2022-03-22 ENCOUNTER — Ambulatory Visit (HOSPITAL_COMMUNITY)
Admission: RE | Admit: 2022-03-22 | Discharge: 2022-03-22 | Disposition: A | Discharge status: Home / Self Care | Payer: Medicaid Other | Source: Ambulatory Visit | Attending: Internal Medicine | Admitting: Internal Medicine

## 2022-03-22 ENCOUNTER — Ambulatory Visit (HOSPITAL_COMMUNITY): Payer: Medicaid Other

## 2022-03-22 ENCOUNTER — Encounter (HOSPITAL_COMMUNITY): Payer: Self-pay

## 2022-03-22 VITALS — BP 110/69 | HR 62 | Temp 99.0°F | Resp 16

## 2022-03-22 DIAGNOSIS — L219 Seborrheic dermatitis, unspecified: Secondary | ICD-10-CM | POA: Insufficient documentation

## 2022-03-22 DIAGNOSIS — B350 Tinea barbae and tinea capitis: Secondary | ICD-10-CM | POA: Insufficient documentation

## 2022-03-22 LAB — CBC WITH DIFFERENTIAL/PLATELET
Abs Immature Granulocytes: 0.01 10*3/uL (ref 0.00–0.07)
Basophils Absolute: 0 10*3/uL (ref 0.0–0.1)
Basophils Relative: 1 %
Eosinophils Absolute: 0.1 10*3/uL (ref 0.0–0.5)
Eosinophils Relative: 3 %
HCT: 37.5 % (ref 36.0–46.0)
Hemoglobin: 12.5 g/dL (ref 12.0–15.0)
Immature Granulocytes: 0 %
Lymphocytes Relative: 40 %
Lymphs Abs: 1.4 10*3/uL (ref 0.7–4.0)
MCH: 28.9 pg (ref 26.0–34.0)
MCHC: 33.3 g/dL (ref 30.0–36.0)
MCV: 86.8 fL (ref 80.0–100.0)
Monocytes Absolute: 0.4 10*3/uL (ref 0.1–1.0)
Monocytes Relative: 12 %
Neutro Abs: 1.6 10*3/uL — ABNORMAL LOW (ref 1.7–7.7)
Neutrophils Relative %: 44 %
Platelets: 295 10*3/uL (ref 150–400)
RBC: 4.32 MIL/uL (ref 3.87–5.11)
RDW: 12.7 % (ref 11.5–15.5)
WBC: 3.6 10*3/uL — ABNORMAL LOW (ref 4.0–10.5)
nRBC: 0 % (ref 0.0–0.2)

## 2022-03-22 LAB — COMPREHENSIVE METABOLIC PANEL
ALT: 14 U/L (ref 0–44)
AST: 17 U/L (ref 15–41)
Albumin: 4 g/dL (ref 3.5–5.0)
Alkaline Phosphatase: 44 U/L (ref 38–126)
Anion gap: 8 (ref 5–15)
BUN: 5 mg/dL — ABNORMAL LOW (ref 6–20)
CO2: 27 mmol/L (ref 22–32)
Calcium: 9.1 mg/dL (ref 8.9–10.3)
Chloride: 104 mmol/L (ref 98–111)
Creatinine, Ser: 0.62 mg/dL (ref 0.44–1.00)
GFR, Estimated: >60 mL/min (ref >60)
Glucose, Bld: 85 mg/dL (ref 70–99)
Potassium: 3.6 mmol/L (ref 3.5–5.1)
Sodium: 139 mmol/L (ref 135–145)
Total Bilirubin: 0.8 mg/dL (ref 0.3–1.2)
Total Protein: 7.8 g/dL (ref 6.5–8.1)

## 2022-03-22 LAB — TSH: TSH: 1.58 u[IU]/mL (ref 0.350–4.500)

## 2022-03-22 MED ORDER — GRISEOFULVIN MICROSIZE 500 MG PO TABS: 500.0000 mg | ORAL_TABLET | Freq: Two times a day (BID) | ORAL | 1 refills | Status: DC

## 2022-03-22 MED ORDER — SELENIUM SULFIDE 2.3 % EX SHAM: MEDICATED_SHAMPOO | CUTANEOUS | 1 refills | Status: DC

## 2022-03-22 NOTE — ED Triage Notes (Signed)
Patient with c/o scalp infection. States she has been seen for it before and referred to dermatology but they aren't seeing new clients for about 5-8 months. Scalp is very itchy and flaky.

## 2022-03-22 NOTE — Discharge Instructions (Signed)
Please wash your hair with prescribed shampoo daily Avoid using harsh creams or application of cosmetics to your hair. Take medications as prescribed If you have worsening symptoms please return to urgent care to be reevaluated. Please keep your dermatology appointment We will call you with recommendations if labs are abnormal.

## 2022-03-22 NOTE — ED Provider Notes (Signed)
[facility]    CSN: [id number] Arrival date & time: 03/22/22  0263      History   Chief Complaint Chief Complaint  Patient presents with   scalp infection    HPI Emily Lucas is a 21 y.o. female to urgent care with itchy scalp of several weeks duration.  Patient describes an itchy scalp with flaking of the scalp over the past month or 2.  Patient has been seen on 2 occasions for the same complaint.  He was initially given ketoconazole shampoo with no improvement in symptoms.  She came back to the urgent care and was prescribed griseofulvin and doxycycline.  Patient did not complete the course of treatment.  She describes worsening rash extending to her forehead and a few localized rashes on the neck and the torso.  The rash is itchy.  No fever or chills.  No erythema of the rash.  No change in hair products.  No change in cosmetics or body lotions. HPI  Past Medical History:  Diagnosis Date   Gallstones    Learning disability    "comprehension"- mother reported    Patient Active Problem List   Diagnosis Date Noted   Acute blood loss anemia 01/26/2020   Postpartum care following vaginal delivery 8/13 01/25/2020   Maternal anemia, with delivery - IDA with superimposed ABL 01/25/2020   Normal labor 01/24/2020   Supervision of normal first teen pregnancy 01/24/2020   Chorioamnionitis in third trimester, fetus 1 01/24/2020   Abnormal cholangiogram    S/P laparoscopic cholecystectomy 01/25/2017    Past Surgical History:  Procedure Laterality Date   cholecystecomy  01/24/2017   CHOLECYSTECTOMY     ERCP N/A 02/01/2017   Procedure: ENDOSCOPIC RETROGRADE CHOLANGIOPANCREATOGRAPHY (ERCP);  Surgeon: Rachael Fee, MD;  Location: [facility] ENDOSCOPY;  Service: Endoscopy;  Laterality: N/A;    OB History     Gravida  1   Para  1   Term  1   Preterm  0   AB  0   Living  1      SAB  0   IAB  0   Ectopic  0   Multiple  0   Live Births  1             Home Medications    Prior to Admission medications   Medication Sig Start Date End Date Taking? Authorizing Provider  griseofulvin (GRIFULVIN V) 500 MG tablet Take 1 tablet (500 mg total) by mouth 2 (two) times daily. 03/22/22 05/21/22 Yes Alisi Lupien, Britta Mccreedy, MD  Selenium Sulfide 2.3 % SHAM Shampoo daily with selenium sulfide shampoo. 03/22/22  Yes Trent Gabler, Britta Mccreedy, MD  Etonogestrel [facility]) Inject into the skin.    [provider]  iron polysaccharides (FERREX 150) 150 MG capsule Take 1 capsule (150 mg total) by mouth daily. 01/27/20 04/30/20  June Leap, CNM    Family History Family History  Problem Relation Age of Onset   Healthy Mother    Healthy Father    Cancer Paternal Grandfather     Social History Social History   Tobacco Use   Smoking status: Never   Smokeless tobacco: Never  Vaping Use   Vaping Use: Never used  Substance Use Topics   Alcohol use: No    Alcohol/week: 0.0 standard drinks of alcohol   Drug use: No     Allergies   Motrin [ibuprofen]   Review of Systems Review of Systems As per HPI  Physical  Exam Triage Vital Signs ED Triage Vitals [03/22/22 0844]  Enc Vitals Group     BP 110/69     Pulse Rate 62     Resp 16     Temp 99 F (37.2 C)     Temp Source Oral     SpO2 95 %     Weight      Height      Head Circumference      Peak Flow      Pain Score      Pain Loc      Pain Edu?      Excl. in GC?    No data found.  Updated Vital Signs BP 110/69 (BP Location: Right Arm)   Pulse 62   Temp 99 F (37.2 C) (Oral)   Resp 16   LMP 03/21/2022 (Exact Date)   SpO2 95%   Visual Acuity Right Eye Distance:   Left Eye Distance:   Bilateral Distance:    Right Eye Near:   Left Eye Near:    Bilateral Near:     Physical Exam Vitals and nursing note reviewed.  Constitutional:      General: She is not in acute distress.    Appearance: Normal appearance. She is not ill-appearing.  Musculoskeletal:        General:  Normal range of motion.  Skin:    Comments: Seborrheic dermatitis involving the scalp.  If you papular rashes with central clearing on the neck and chest.  No significant erythema or discharge.  Neurological:     Mental Status: She is alert.      UC Treatments / Results  Labs (all labs ordered are listed, but only abnormal results are displayed) Labs Reviewed  CBC WITH DIFFERENTIAL/PLATELET - Abnormal; Notable for the following components:      Result Value   WBC 3.6 (*)    Neutro Abs 1.6 (*)    All other components within normal limits  COMPREHENSIVE METABOLIC PANEL - Abnormal; Notable for the following components:   BUN 5 (*)    All other components within normal limits  TSH    EKG   Radiology No results found.  Procedures Procedures (including critical care time)  Medications Ordered in UC Medications - No data to display  Initial Impression / Assessment and Plan / UC Course  I have reviewed the triage vital signs and the nursing notes.  Pertinent labs & imaging results that were available during my care of the patient were reviewed by me and considered in my medical decision making (see chart for details).     1.  Tinea capitis: Griseofulvin 500 mg twice daily for 6 weeks Selenium sulfide shampoo Patient is advised to use mild soap and hypoallergenic products. Return precautions given Patient is advised to keep a dermatology appointment which is scheduled about 5 months out.  Final Clinical Impressions(s) / UC Diagnoses   Final diagnoses:  Tinea capitis  Seborrheic dermatitis of scalp     Discharge Instructions      Please wash your hair with prescribed shampoo daily Avoid using harsh creams or application of cosmetics to your hair. Take medications as prescribed If you have worsening symptoms please return to urgent care to be reevaluated. Please keep your dermatology appointment We will call you with recommendations if labs are abnormal.   ED  Prescriptions     Medication Sig Dispense Auth. Provider   Selenium Sulfide 2.3 % SHAM Shampoo daily with selenium sulfide shampoo. 180  mL Britaney Espaillat, Myrene Galas, MD   griseofulvin (GRIFULVIN V) 500 MG tablet Take 1 tablet (500 mg total) by mouth 2 (two) times daily. 60 tablet Lloyd Ayo, Myrene Galas, MD      PDMP not reviewed this encounter.   Chase Picket, MD 03/22/22 [phone]

## 2022-03-23 ENCOUNTER — Telehealth (HOSPITAL_COMMUNITY): Payer: Self-pay

## 2022-03-23 ENCOUNTER — Telehealth (HOSPITAL_COMMUNITY): Payer: Self-pay | Admitting: Emergency Medicine

## 2022-03-23 MED ORDER — KETOCONAZOLE 2 % EX SHAM: 1.0000 | MEDICATED_SHAMPOO | CUTANEOUS | 0 refills | Status: DC

## 2022-03-23 MED ORDER — TERBINAFINE HCL 250 MG PO TABS: 250.0000 mg | ORAL_TABLET | Freq: Every day | ORAL | 0 refills | Status: AC

## 2022-03-23 NOTE — Telephone Encounter (Signed)
Griseofulvin discontinued. Lamisil prescribed. Ketoconazole shampoo ordered in place of Selenium sulfide.

## 2022-03-23 NOTE — Telephone Encounter (Signed)
Patient calling states that the shampoo was  out of stock at the [facility] sent to. Called to inquire if the Patient would like the medication sent to a different [facility].    Patient states she had to call back in today and speak to the nurse about insurance not covering the medications sent in and needing a new prescription. Patient states the nurse was going to speak to the doctor and give her a call back.   Patient is awaiting a return call from the nurse.

## 2022-03-23 NOTE — Telephone Encounter (Signed)
Updated the patient on the new prescriptions sent by Dr. Lanny Cramp, she verbalized understanding

## 2022-05-04 ENCOUNTER — Ambulatory Visit (HOSPITAL_COMMUNITY)
Admission: EM | Admit: 2022-05-04 | Discharge: 2022-05-04 | Disposition: A | Discharge status: Home / Self Care | Payer: Medicaid Other

## 2022-05-04 ENCOUNTER — Encounter (HOSPITAL_COMMUNITY): Payer: Self-pay | Admitting: *Deleted

## 2022-05-04 DIAGNOSIS — M79602 Pain in left arm: Secondary | ICD-10-CM | POA: Diagnosis not present

## 2022-05-04 DIAGNOSIS — L02412 Cutaneous abscess of left axilla: Secondary | ICD-10-CM | POA: Diagnosis not present

## 2022-05-04 MED ORDER — LIDOCAINE HCL (PF) 1 % IJ SOLN
INTRAMUSCULAR | Status: AC
  Filled 2022-05-04: qty 30

## 2022-05-04 NOTE — Discharge Instructions (Signed)
Advised to use warm compresses frequently to the area to help encourage drainage. Advised take Tylenol as needed for pain relief. Advised to report to PCP or return to urgent care if symptoms fail to improve.

## 2022-05-04 NOTE — ED Provider Notes (Signed)
[place]    CSN: [id number] Arrival date & time: 05/04/22  1009      History   Chief Complaint Chief Complaint  Patient presents with   Abscess    HPI Emily Lucas is a 21 y.o. female.   21 year old female presents with cyst under left arm.  Patient indicates for the past week she has been noticing a cyst develop under her left armpit.  She relates that this is happened before but it resolved on its own.  She indicates over the past week it has continued to get larger and over the past couple days it started hurting and causing discomfort.  She relates that she is not having any drainage from the site.  She indicates she has not taken anything for pain relief and has not been using any warm compresses to the area.  Patient desires to have the area drained in order to relieve the discomfort.   Abscess   Past Medical History:  Diagnosis Date   Gallstones    Learning disability    "comprehension"- mother reported    Patient Active Problem List   Diagnosis Date Noted   Acute blood loss anemia 01/26/2020   Postpartum care following vaginal delivery 8/13 01/25/2020   Maternal anemia, with delivery - IDA with superimposed ABL 01/25/2020   Normal labor 01/24/2020   Supervision of normal first teen pregnancy 01/24/2020   Chorioamnionitis in third trimester, fetus 1 01/24/2020   Abnormal cholangiogram    S/P laparoscopic cholecystectomy 01/25/2017    Past Surgical History:  Procedure Laterality Date   cholecystecomy  01/24/2017   CHOLECYSTECTOMY     ERCP N/A 02/01/2017   Procedure: ENDOSCOPIC RETROGRADE CHOLANGIOPANCREATOGRAPHY (ERCP);  Surgeon: Milus Banister, MD;  Location: [facility] ENDOSCOPY;  Service: Endoscopy;  Laterality: N/A;    OB History     Gravida  1   Para  1   Term  1   Preterm  0   AB  0   Living  1      SAB  0   IAB  0   Ectopic  0   Multiple  0   Live Births  1            Home Medications    Prior to Admission  medications   Medication Sig Start Date End Date Taking? Authorizing Provider  Etonogestrel (NEXPLANON [place]) Inject into the skin.   Yes [provider]  ketoconazole (NIZORAL) 2 % shampoo Apply 1 Application topically 2 (two) times a week. 03/25/22   LampteyMyrene Galas, MD  Selenium Sulfide 2.3 % SHAM Shampoo daily with selenium sulfide shampoo. 03/22/22   LampteyMyrene Galas, MD  iron polysaccharides (FERREX 150) 150 MG capsule Take 1 capsule (150 mg total) by mouth daily. 01/27/20 04/30/20  Suzan Nailer, CNM    Family History Family History  Problem Relation Age of Onset   Healthy Mother    Healthy Father    Cancer Paternal Grandfather     Social History Social History   Tobacco Use   Smoking status: Never   Smokeless tobacco: Never  Vaping Use   Vaping Use: Never used  Substance Use Topics   Alcohol use: No    Alcohol/week: 0.0 standard drinks of alcohol   Drug use: No     Allergies   Motrin [ibuprofen]   Review of Systems Review of Systems  Skin:  Positive for wound (left axilla cyst.).     Physical Exam  Triage Vital Signs ED Triage Vitals  Enc Vitals Group     BP 05/04/22 1104 106/68     Pulse Rate 05/04/22 1104 78     Resp 05/04/22 1104 18     Temp 05/04/22 1104 98.2 F (36.8 C)     Temp Source 05/04/22 1104 Oral     SpO2 05/04/22 1104 99 %     Weight --      Height --      Head Circumference --      Peak Flow --      Pain Score 05/04/22 1102 6     Pain Loc --      Pain Edu? --      Excl. in GC? --    No data found.  Updated Vital Signs BP 106/68 (BP Location: Left Arm)   Pulse 78   Temp 98.2 F (36.8 C) (Oral)   Resp 18   LMP 03/28/2022 (Approximate)   SpO2 99%   Visual Acuity Right Eye Distance:   Left Eye Distance:   Bilateral Distance:    Right Eye Near:   Left Eye Near:    Bilateral Near:     Physical Exam Constitutional:      Appearance: Normal appearance.  Skin:         Comments: Left axilla: There is a 2 cm  fluctuant cyst in the midportion of the left axilla.  There is minimal redness present, no active drainage.  Procedure: The axilla and cyst area is cleaned with Betadine, anesthetized with 2.5 cc of lidocaine.  The area is incised in the central portion of the cyst, purulent material was expressed, bandage is applied, no complications during procedure.  Neurological:     Mental Status: She is alert.      UC Treatments / Results  Labs (all labs ordered are listed, but only abnormal results are displayed) Labs Reviewed - No data to display  EKG   Radiology No results found.  Procedures Procedures (including critical care time)  Medications Ordered in UC Medications - No data to display  Initial Impression / Assessment and Plan / UC Course  I have reviewed the triage vital signs and the nursing notes.  Pertinent labs & imaging results that were available during my care of the patient were reviewed by me and considered in my medical decision making (see chart for details).    Plan: 1.  The abscess of the left axilla be treated with the following: A.  Advised to place warm compresses frequently to the area to encourage continued draining after I&D performed. 2.  The pain of the left arm will be treated with the following: A.  Advised Tylenol, 2 tablets every 6-8 hours needed for pain. 3.  Advised follow-up PCP or return to urgent care if symptoms fail to improve. Final Clinical Impressions(s) / UC Diagnoses   Final diagnoses:  Abscess of left axilla  Arm pain, left     Discharge Instructions      Advised to use warm compresses frequently to the area to help encourage drainage. Advised take Tylenol as needed for pain relief. Advised to report to PCP or return to urgent care if symptoms fail to improve.     ED Prescriptions   None    PDMP not reviewed this encounter.   Ellsworth Lennox, PA-C 05/04/22 1211

## 2022-05-04 NOTE — ED Triage Notes (Signed)
Pt states she has a cyst under her left arm in her armpit x 1 week it got better but came back and not is painful. She hasn't done anything to try and relieve the pain.

## 2022-06-05 ENCOUNTER — Encounter (HOSPITAL_COMMUNITY): Payer: Self-pay

## 2022-06-05 ENCOUNTER — Ambulatory Visit (HOSPITAL_COMMUNITY)
Admission: RE | Admit: 2022-06-05 | Discharge: 2022-06-05 | Disposition: A | Discharge status: Home / Self Care | Payer: Medicaid Other | Source: Ambulatory Visit | Attending: Internal Medicine | Admitting: Internal Medicine

## 2022-06-05 VITALS — BP 99/65 | HR 73 | Temp 98.5°F | Resp 16 | Ht 64.0 in | Wt 140.0 lb

## 2022-06-05 DIAGNOSIS — L209 Atopic dermatitis, unspecified: Secondary | ICD-10-CM

## 2022-06-05 DIAGNOSIS — R21 Rash and other nonspecific skin eruption: Secondary | ICD-10-CM

## 2022-06-05 MED ORDER — DEXAMETHASONE SODIUM PHOSPHATE 10 MG/ML IJ SOLN
INTRAMUSCULAR | Status: AC
  Filled 2022-06-05: qty 1

## 2022-06-05 MED ORDER — DEXAMETHASONE SODIUM PHOSPHATE 10 MG/ML IJ SOLN
10.0000 mg | Freq: Once | INTRAMUSCULAR | Status: AC
  Administered 2022-06-05: 10 mg via INTRAMUSCULAR

## 2022-06-05 MED ORDER — PREDNISOLONE 15 MG/5ML PO SOLN: 45.0000 mg | Freq: Every day | ORAL | 0 refills | Status: AC

## 2022-06-05 NOTE — Discharge Instructions (Addendum)
Your rash is due to eczema (atopic dermatitis). This is an inflammatory rash that comes and goes.  I would like for you to purchase Aquaphor. When you get out of the shower, pat your body dry and place Aquaphor to the areas of the rash to provide moisture.  You may also apply Aquaphor other times during the day (1-2 times a day).   I gave you a steroid injection in the clinic to help calm down the inflammation and itching.  Start taking prednisolone oral liquid 15 mL (45 mg) once a day with breakfast starting tomorrow morning. Do not take any NSAID medications (ibuprofen or Aleve) when taking the steroid.   Schedule an appointment with the dermatologist listed on your paperwork for further evaluation.   If you develop any new or worsening symptoms or do not improve in the next 2 to 3 days, please return.  If your symptoms are severe, please go to the emergency room.  Follow-up with your primary care provider for further evaluation and management of your symptoms as well as ongoing wellness visits.  I hope you feel better!

## 2022-06-05 NOTE — ED Triage Notes (Signed)
Chief Complaint: Patient having hives and itching all over. States she though she had a yeast infection under her breast. Patient stopped using her new detergent.   Onset: 1 month  Prescriptions or OTC medications tried: Yes- coconut butter; eczema cream    with no relief  Sick exposure: No  New foods, medications, or products: No  Recent Travel: No

## 2022-06-05 NOTE — ED Provider Notes (Signed)
[facility]    CSN: [id number] Arrival date & time: 06/05/22  1544      History   Chief Complaint Chief Complaint  Patient presents with   Rash    HPI Emily Lucas is a 21 y.o. female.   Patient presents urgent care for evaluation of rash to the bilateral breasts that started 2 weeks ago and has now spread to the abdomen, chest wall, neck, and upper back as of a couple of days ago.  Rash is intensely pruritic and dry.  Patient changed her laundry detergent approximately 2 months ago but denies any new use of personal hygiene products, creams, lotions, or make-up products.  No known exposure to any new irritants including poisonous plants and bug bites.  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  Denies attempted use of over-the-counter medicines prior to arrival urgent care for rash.     Past Medical History:  Diagnosis Date   Gallstones    Learning disability    "comprehension"- mother reported    Patient Active Problem List   Diagnosis Date Noted   Acute blood loss anemia 01/26/2020   Postpartum care following vaginal delivery 8/13 01/25/2020   Maternal anemia, with delivery - IDA with superimposed ABL 01/25/2020   Normal labor 01/24/2020   Supervision of normal first teen pregnancy 01/24/2020   Chorioamnionitis in third trimester, fetus 1 01/24/2020   Abnormal cholangiogram    S/P laparoscopic cholecystectomy 01/25/2017    Past Surgical History:  Procedure Laterality Date   cholecystecomy  01/24/2017   CHOLECYSTECTOMY     ERCP N/A 02/01/2017   Procedure: ENDOSCOPIC RETROGRADE CHOLANGIOPANCREATOGRAPHY (ERCP);  Surgeon: Rachael Fee, MD;  Location: [facility] ENDOSCOPY;  Service: Endoscopy;  Laterality: N/A;    OB History     Gravida  1   Para  1   Term  1   Preterm  0   AB  0   Living  1      SAB  0   IAB  0   Ectopic  0    Multiple  0   Live Births  1            Home Medications    Prior to Admission medications   Medication Sig Start Date End Date Taking? Authorizing Provider  Etonogestrel (NEXPLANON [place]) Inject into the skin.   Yes [provider]  prednisoLONE (PRELONE) 15 MG/5ML SOLN Take 15 mLs (45 mg total) by mouth daily before breakfast for 5 days. 06/05/22 06/10/22 Yes Calib Wadhwa, Donavan Burnet, FNP  ketoconazole (NIZORAL) 2 % shampoo Apply 1 Application topically 2 (two) times a week. 03/25/22   LampteyBritta Mccreedy, MD  Selenium Sulfide 2.3 % SHAM Shampoo daily with selenium sulfide shampoo. 03/22/22   LampteyBritta Mccreedy, MD  iron polysaccharides (FERREX 150) 150 MG capsule Take 1 capsule (150 mg total) by mouth daily. 01/27/20 04/30/20  June Leap, CNM    Family History Family History  Problem Relation Age of Onset   Healthy Mother    Healthy Father    Cancer Paternal Grandfather     Social History Social History   Tobacco Use   Smoking status: Never   Smokeless tobacco: Never  Vaping Use   Vaping Use: Never used  Substance Use Topics   Alcohol use: No    Alcohol/week: 0.0 standard drinks of alcohol  Drug use: No     Allergies   Motrin [ibuprofen]   Review of Systems Review of Systems Per HPI  Physical Exam Triage Vital Signs ED Triage Vitals  Enc Vitals Group     BP 06/05/22 1603 99/65     Pulse Rate 06/05/22 1603 73     Resp 06/05/22 1603 16     Temp 06/05/22 1603 98.5 F (36.9 C)     Temp Source 06/05/22 1603 Oral     SpO2 06/05/22 1603 98 %     Weight 06/05/22 1606 140 lb (63.5 kg)     Height 06/05/22 1606 5\' 4"  (1.626 m)     Head Circumference --      Peak Flow --      Pain Score 06/05/22 1603 0     Pain Loc --      Pain Edu? --      Excl. in GC? --    No data found.  Updated Vital Signs BP 99/65 (BP Location: Left Arm)   Pulse 73   Temp 98.5 F (36.9 C) (Oral)   Resp 16   Ht 5\' 4"  (1.626 m)   Wt 140 lb (63.5 kg)   LMP 03/14/2022  (Approximate)   SpO2 98%   BMI 24.03 kg/m   Visual Acuity Right Eye Distance:   Left Eye Distance:   Bilateral Distance:    Right Eye Near:   Left Eye Near:    Bilateral Near:     Physical Exam Vitals and nursing note reviewed.  Constitutional:      Appearance: She is not ill-appearing or toxic-appearing.  HENT:     Head: Normocephalic and atraumatic.     Right Ear: Hearing and external ear normal.     Left Ear: Hearing and external ear normal.     Nose: Nose normal.     Mouth/Throat:     Lips: Pink.  Eyes:     General: Lids are normal. Vision grossly intact. Gaze aligned appropriately.     Extraocular Movements: Extraocular movements intact.     Conjunctiva/sclera: Conjunctivae normal.  Pulmonary:     Effort: Pulmonary effort is normal.  Musculoskeletal:     Cervical back: Neck supple.  Skin:    General: Skin is warm and dry.     Capillary Refill: Capillary refill takes less than 2 seconds.     Findings: No rash.     Comments: Dry, pruritic, and patchy diffuse rash to the bilateral breasts, posterior neck, chest wall, abdomen, and trunk as well as the flexor surfaces of the bilateral upper extremities.  Rash is erythematous without warmth or drainage.  See images below for further detail.  Neurological:     General: No focal deficit present.     Mental Status: She is alert and oriented to person, place, and time. Mental status is at baseline.     Cranial Nerves: No dysarthria or facial asymmetry.  Psychiatric:        Mood and Affect: Mood normal.        Speech: Speech normal.        Behavior: Behavior normal.        Thought Content: Thought content normal.        Judgment: Judgment normal.             UC Treatments / Results  Labs (all labs ordered are listed, but only abnormal results are displayed) Labs Reviewed - No data to display  EKG   Radiology  No results found.  Procedures Procedures (including critical care time)  Medications Ordered  in UC Medications  dexamethasone (DECADRON) injection 10 mg (has no administration in time range)    Initial Impression / Assessment and Plan / UC Course  I have reviewed the triage vital signs and the nursing notes.  Pertinent labs & imaging results that were available during my care of the patient were reviewed by me and considered in my medical decision making (see chart for details).   1.  Atopic dermatitis, rash and nonspecific skin eruption Presentation is consistent with acute atopic dermatitis eruption.  Dexamethasone 10 mg IM provided to reduce inflammation and itching.  Prednisolone burst 45 mg daily with breakfast for the next 5 days sent to pharmacy.  Patient requests liquid medicine to be sent to the pharmacy and not pills due to trouble swallowing pills.  Recommend Aquaphor emollient use 1-2 times daily by placing thin layer to affected areas.  Walking referral to dermatology provided.  Patient to follow-up with Derm if needed.  Discussed physical exam and available lab work findings in clinic with patient.  Counseled patient regarding appropriate use of medications and potential side effects for all medications recommended or prescribed today. Discussed red flag signs and symptoms of worsening condition,when to call the PCP office, return to urgent care, and when to seek higher level of care in the emergency department. Patient verbalizes understanding and agreement with plan. All questions answered. Patient discharged in stable condition.    Final Clinical Impressions(s) / UC Diagnoses   Final diagnoses:  Atopic dermatitis in adult  Rash and nonspecific skin eruption     Discharge Instructions      Your rash is due to eczema (atopic dermatitis). This is an inflammatory rash that comes and goes.  I would like for you to purchase Aquaphor. When you get out of the shower, pat your body dry and place Aquaphor to the areas of the rash to provide moisture.  You may also  apply Aquaphor other times during the day (1-2 times a day).   I gave you a steroid injection in the clinic to help calm down the inflammation and itching.  Start taking prednisolone oral liquid 15 mL (45 mg) once a day with breakfast starting tomorrow morning. Do not take any NSAID medications (ibuprofen or Aleve) when taking the steroid.   Schedule an appointment with the dermatologist listed on your paperwork for further evaluation.   If you develop any new or worsening symptoms or do not improve in the next 2 to 3 days, please return.  If your symptoms are severe, please go to the emergency room.  Follow-up with your primary care provider for further evaluation and management of your symptoms as well as ongoing wellness visits.  I hope you feel better!     ED Prescriptions     Medication Sig Dispense Auth. Provider   prednisoLONE (PRELONE) 15 MG/5ML SOLN Take 15 mLs (45 mg total) by mouth daily before breakfast for 5 days. 75 mL Carlisle Beers, FNP      PDMP not reviewed this encounter.   Carlisle Beers, [place] 06/05/22 [phone]

## 2022-07-01 ENCOUNTER — Other Ambulatory Visit: Payer: Self-pay

## 2022-07-01 ENCOUNTER — Ambulatory Visit (HOSPITAL_COMMUNITY)
Admission: RE | Admit: 2022-07-01 | Discharge: 2022-07-01 | Disposition: A | Discharge status: Home / Self Care | Payer: Medicaid Other | Source: Ambulatory Visit | Attending: Emergency Medicine | Admitting: Emergency Medicine

## 2022-07-01 ENCOUNTER — Encounter (HOSPITAL_COMMUNITY): Payer: Self-pay

## 2022-07-01 VITALS — BP 107/69 | HR 72 | Temp 98.4°F | Resp 18

## 2022-07-01 DIAGNOSIS — L308 Other specified dermatitis: Secondary | ICD-10-CM | POA: Diagnosis not present

## 2022-07-01 MED ORDER — TRIAMCINOLONE ACETONIDE 0.1 % EX CREA: 1.0000 | TOPICAL_CREAM | Freq: Two times a day (BID) | CUTANEOUS | 0 refills | Status: DC

## 2022-07-01 NOTE — ED Triage Notes (Signed)
Seen 06/06/2023 for eczema.  Patient reports the medicine she was prescribed didn't help.  Patient want s a particular cream ( at [place] visit was taking steroids, therefore, cream was not an option)  Patient is questioning the rash to breast.  It has been suggested that this may be a yeast infection

## 2022-07-01 NOTE — ED Provider Notes (Signed)
[place]    CSN: [id number] Arrival date & time: 07/01/22  1606      History   Chief Complaint Chief Complaint  Patient presents with   Rash    Entered by patient   Appointment    5:00 pm    HPI Jeweldean A Buresh is a 22 y.o. female.  Presents for eczema rash Reports she gets areas under her breasts and flexural arms She was seen in December, given short steroid course for eczema on her neck chest belly arms.  Most of that has improved She prefers the cream Requesting refill on triamcinolone  Past Medical History:  Diagnosis Date   Gallstones    Learning disability    "comprehension"- mother reported    Patient Active Problem List   Diagnosis Date Noted   Acute blood loss anemia 01/26/2020   Postpartum care following vaginal delivery 8/13 01/25/2020   Maternal anemia, with delivery - IDA with superimposed ABL 01/25/2020   Normal labor 01/24/2020   Supervision of normal first teen pregnancy 01/24/2020   Chorioamnionitis in third trimester, fetus 1 01/24/2020   Abnormal cholangiogram    S/P laparoscopic cholecystectomy 01/25/2017    Past Surgical History:  Procedure Laterality Date   cholecystecomy  01/24/2017   CHOLECYSTECTOMY     ERCP N/A 02/01/2017   Procedure: ENDOSCOPIC RETROGRADE CHOLANGIOPANCREATOGRAPHY (ERCP);  Surgeon: Milus Banister, MD;  Location: [facility] ENDOSCOPY;  Service: Endoscopy;  Laterality: N/A;    OB History     Gravida  1   Para  1   Term  1   Preterm  0   AB  0   Living  1      SAB  0   IAB  0   Ectopic  0   Multiple  0   Live Births  1            Home Medications    Prior to Admission medications   Medication Sig Start Date End Date Taking? Authorizing Provider  triamcinolone cream (KENALOG) 0.1 % Apply 1 Application topically 2 (two) times daily. 07/01/22  Yes Lanell Carpenter, PA-C  Etonogestrel (NEXPLANON [place]) Inject into the skin.    [provider]  ketoconazole (NIZORAL) 2 % shampoo  Apply 1 Application topically 2 (two) times a week. Patient not taking: Reported on 07/01/2022 03/25/22   Chase Picket, MD  Selenium Sulfide 2.3 % SHAM Shampoo daily with selenium sulfide shampoo. Patient not taking: Reported on 07/01/2022 03/22/22   Chase Picket, MD  iron polysaccharides (FERREX 150) 150 MG capsule Take 1 capsule (150 mg total) by mouth daily. 01/27/20 04/30/20  Suzan Nailer, CNM    Family History Family History  Problem Relation Age of Onset   Healthy Mother    Healthy Father    Cancer Paternal Grandfather     Social History Social History   Tobacco Use   Smoking status: Never   Smokeless tobacco: Never  Vaping Use   Vaping Use: Never used  Substance Use Topics   Alcohol use: No    Alcohol/week: 0.0 standard drinks of alcohol   Drug use: No     Allergies   Motrin [ibuprofen]   Review of Systems Review of Systems  Skin:  Positive for rash.   Per HPI  Physical Exam Triage Vital Signs ED Triage Vitals  Enc Vitals Group     BP 07/01/22 1657 107/69     Pulse Rate 07/01/22 1657 72  Resp 07/01/22 1657 18     Temp 07/01/22 1657 98.4 F (36.9 C)     Temp Source 07/01/22 1657 Oral     SpO2 07/01/22 1657 99 %     Weight --      Height --      Head Circumference --      Peak Flow --      Pain Score 07/01/22 1654 0     Pain Loc --      Pain Edu? --      Excl. in GC? --    No data found.  Updated Vital Signs BP 107/69 (BP Location: Left Arm)   Pulse 72   Temp 98.4 F (36.9 C) (Oral)   Resp 18   LMP 03/14/2022 (Approximate)   SpO2 99%    Physical Exam Vitals and nursing note reviewed.  Constitutional:      General: She is not in acute distress.    Appearance: Normal appearance.  HENT:     Mouth/Throat:     Mouth: Mucous membranes are moist.     Pharynx: Oropharynx is clear. No posterior oropharyngeal erythema.  Eyes:     Conjunctiva/sclera: Conjunctivae normal.     Pupils: Pupils are equal, round, and reactive to  light.  Cardiovascular:     Rate and Rhythm: Normal rate and regular rhythm.     Pulses: Normal pulses.     Heart sounds: Normal heart sounds.  Pulmonary:     Effort: Pulmonary effort is normal.     Breath sounds: Normal breath sounds.  Musculoskeletal:        General: Normal range of motion.     Cervical back: Normal range of motion.  Skin:    Findings: Rash present.          Comments: Eczema in areas noted  Neurological:     Mental Status: She is alert and oriented to person, place, and time.      UC Treatments / Results  Labs (all labs ordered are listed, but only abnormal results are displayed) Labs Reviewed - No data to display  EKG   Radiology No results found.  Procedures Procedures (including critical care time)  Medications Ordered in UC Medications - No data to display  Initial Impression / Assessment and Plan / UC Course  I have reviewed the triage vital signs and the nursing notes.  Pertinent labs & imaging results that were available during my care of the patient were reviewed by me and considered in my medical decision making (see chart for details).  Kenalog cream twice daily for 7 days to the areas of itch.  Discussed using topical emollient between steroid applications.  Also discussed follow-up with primary care for further evaluation and treatment as needed. Added to assistance list. Patient agrees to plan  Final Clinical Impressions(s) / UC Diagnoses   Final diagnoses:  Other eczema     Discharge Instructions      Kenalog cream applied twice daily to the areas of itch. Use no longer than 7 days. Use lotion or Aquaphor in between applications to keep the skin moist.  Someone from our clinic will reach out to you and help you establish with a primary care provider.    ED Prescriptions     Medication Sig Dispense Auth. Provider   triamcinolone cream (KENALOG) 0.1 % Apply 1 Application topically 2 (two) times daily. 15 g Nasya Vincent,  Lurena Joiner, PA-C      PDMP not reviewed this encounter.  Kyra Leyland 07/01/22 1905

## 2022-07-01 NOTE — Discharge Instructions (Addendum)
Kenalog cream applied twice daily to the areas of itch. Use no longer than 7 days. Use lotion or Aquaphor in between applications to keep the skin moist.  Someone from our clinic will reach out to you and help you establish with a primary care provider.

## 2022-07-02 ENCOUNTER — Encounter (HOSPITAL_COMMUNITY): Payer: Self-pay

## 2022-07-05 ENCOUNTER — Encounter: Payer: Self-pay | Admitting: Family Medicine

## 2022-07-05 ENCOUNTER — Ambulatory Visit (INDEPENDENT_AMBULATORY_CARE_PROVIDER_SITE_OTHER): Payer: Medicaid Other | Admitting: Family Medicine

## 2022-07-05 VITALS — BP 90/58 | HR 75 | Temp 98.6°F | Ht 64.5 in | Wt 143.4 lb

## 2022-07-05 DIAGNOSIS — L2082 Flexural eczema: Secondary | ICD-10-CM | POA: Diagnosis not present

## 2022-07-05 MED ORDER — TRIAMCINOLONE ACETONIDE 0.1 % EX CREA: 1.0000 | TOPICAL_CREAM | Freq: Two times a day (BID) | CUTANEOUS | 2 refills | Status: DC

## 2022-07-05 NOTE — Progress Notes (Unsigned)
New Patient Office Visit  Subjective    Patient ID: Emily Lucas, female    DOB: 27-Sep-2000  Age: 22 y.o. MRN: [id number]  CC:  Chief Complaint  Patient presents with   Establish Care    HPI Emily Lucas presents to establish care Patient states that she has developed eczema and states that she has tried topical steroids however it is relatively diffuse and there tube wasn't very large.  She reports this has been going on for at least 6 months. States that it started as seborrheic dermatitis of the scalp. She was given shampoos which did help, but then it spread down her neck and to her chest and abdomen.   Outpatient Encounter Medications as of 07/05/2022  Medication Sig   triamcinolone cream (KENALOG) 0.1 % Apply 1 Application topically 2 (two) times daily.   Etonogestrel (NEXPLANON [place]) Inject into the skin.   ketoconazole (NIZORAL) 2 % shampoo Apply 1 Application topically 2 (two) times a week. (Patient not taking: Reported on 07/01/2022)   Selenium Sulfide 2.3 % SHAM Shampoo daily with selenium sulfide shampoo. (Patient not taking: Reported on 07/01/2022)   triamcinolone cream (KENALOG) 0.1 % Apply 1 Application topically 2 (two) times daily.   [DISCONTINUED] iron polysaccharides (FERREX 150) 150 MG capsule Take 1 capsule (150 mg total) by mouth daily.   No facility-administered encounter medications on file as of 07/05/2022.    Past Medical History:  Diagnosis Date   Gallstones    Learning disability    "comprehension"- mother reported    Past Surgical History:  Procedure Laterality Date   cholecystecomy  01/24/2017   CHOLECYSTECTOMY     ERCP N/A 02/01/2017   Procedure: ENDOSCOPIC RETROGRADE CHOLANGIOPANCREATOGRAPHY (ERCP);  Surgeon: Milus Banister, MD;  Location: [facility] ENDOSCOPY;  Service: Endoscopy;  Laterality: N/A;    Family History  Problem Relation Age of Onset   Healthy Mother    Healthy Father     Social History   Socioeconomic History   Marital  status: Single    Spouse name: Not on file   Number of children: Not on file   Years of education: Not on file   Highest education level: Not on file  Occupational History   Not on file  Tobacco Use   Smoking status: Never   Smokeless tobacco: Never  Vaping Use   Vaping Use: Never used  Substance and Sexual Activity   Alcohol use: Never   Drug use: Never   Sexual activity: Yes    Birth control/protection: Implant  Other Topics Concern   Not on file  Social History Narrative   Not on file   Social Determinants of Health   Financial Resource Strain: Not on file  Food Insecurity: Not on file  Transportation Needs: Not on file  Physical Activity: Not on file  Stress: Not on file  Social Connections: Not on file  Intimate Partner Violence: Not on file    Review of Systems  All other systems reviewed and are negative.       Objective    BP (!) 90/58 (BP Location: Left Arm, Patient Position: Sitting, Cuff Size: Normal)   Pulse 75   Temp 98.6 F (37 C) (Oral)   Ht 5' 4.5" (1.638 m)   Wt 143 lb 6.4 oz (65 kg)   LMP 03/14/2022 (Approximate)   SpO2 98%   BMI 24.23 kg/m   Physical Exam Vitals reviewed.  Constitutional:      Appearance: Normal appearance. She  is well-groomed and normal weight.  Eyes:     Conjunctiva/sclera: Conjunctivae normal.  Neck:     Thyroid: No thyromegaly.  Cardiovascular:     Rate and Rhythm: Normal rate and regular rhythm.     Pulses: Normal pulses.     Heart sounds: S1 normal and S2 normal.  Pulmonary:     Effort: Pulmonary effort is normal.     Breath sounds: Normal breath sounds and air entry.  Abdominal:     General: Bowel sounds are normal.  Musculoskeletal:     Right lower leg: No edema.     Left lower leg: No edema.  Neurological:     Mental Status: She is alert and oriented to person, place, and time. Mental status is at baseline.     Gait: Gait is intact.  Psychiatric:        Mood and Affect: Mood and affect normal.         Speech: Speech normal.        Behavior: Behavior normal.        Judgment: Judgment normal.     {Labs (Optional):23779}    Assessment & Plan:   Problem List Items Addressed This Visit   None Visit Diagnoses     Flexural eczema    -  Primary   Relevant Medications   triamcinolone cream (KENALOG) 0.1 %       No follow-ups on file.   Farrel Conners, MD

## 2022-07-09 ENCOUNTER — Ambulatory Visit: Payer: Medicaid Other | Admitting: Family Medicine

## 2022-07-20 ENCOUNTER — Other Ambulatory Visit (HOSPITAL_COMMUNITY)
Admission: RE | Admit: 2022-07-20 | Discharge: 2022-07-20 | Disposition: A | Discharge status: Home / Self Care | Payer: Medicaid Other | Source: Ambulatory Visit | Attending: Family Medicine | Admitting: Family Medicine

## 2022-07-20 ENCOUNTER — Ambulatory Visit (INDEPENDENT_AMBULATORY_CARE_PROVIDER_SITE_OTHER): Payer: Medicaid Other | Admitting: Family Medicine

## 2022-07-20 VITALS — BP 100/58 | HR 67 | Temp 98.2°F | Ht 64.5 in | Wt 142.5 lb

## 2022-07-20 DIAGNOSIS — A749 Chlamydial infection, unspecified: Secondary | ICD-10-CM | POA: Diagnosis not present

## 2022-07-20 DIAGNOSIS — Z7251 High risk heterosexual behavior: Secondary | ICD-10-CM | POA: Insufficient documentation

## 2022-07-20 NOTE — Progress Notes (Signed)
   Established Patient Office Visit  Subjective   Patient ID: Emily Lucas, female    DOB: [birth year]  Age: 22 y.o. MRN: [id number]  Chief Complaint  Patient presents with   patient requests STD testing-denies exposure    Patient states she would like to be checked for STI's. No fever/chills, no unsual discharge, no pelvic pain, no abnormal bleeding. Does have unprotected intercourse. Patient state she has been positive in the past. No known current exposures.       Review of Systems  All other systems reviewed and are negative.     Objective:     BP (!) 100/58 (BP Location: Left Arm, Patient Position: Sitting, Cuff Size: Normal)   Pulse 67   Temp 98.2 F (36.8 C) (Oral)   Ht 5' 4.5" (1.638 m)   Wt 142 lb 8 oz (64.6 kg)   SpO2 100%   BMI 24.08 kg/m    Physical Exam Vitals reviewed.  Cardiovascular:     Rate and Rhythm: Normal rate and regular rhythm.     Heart sounds: Normal heart sounds.  Abdominal:     General: Abdomen is flat. Bowel sounds are normal.     Palpations: Abdomen is soft.      No results found for any visits on 07/20/22.    The ASCVD Risk score (Arnett DK, et al., 2019) failed to calculate for the following reasons:   The 2019 ASCVD risk score is only valid for ages 5 to 80    Assessment & Plan:   Problem List Items Addressed This Visit   None Visit Diagnoses     Unprotected sexual intercourse    -  Primary   Relevant Orders   Cervicovaginal ancillary only     Testing ordered. Pt denies any current symptoms, will call patient with results.   No follow-ups on file.    Farrel Conners, MD

## 2022-07-22 ENCOUNTER — Telehealth: Payer: Self-pay | Admitting: Family Medicine

## 2022-07-22 ENCOUNTER — Other Ambulatory Visit: Payer: Self-pay | Admitting: Family Medicine

## 2022-07-22 DIAGNOSIS — A749 Chlamydial infection, unspecified: Secondary | ICD-10-CM

## 2022-07-22 LAB — CERVICOVAGINAL ANCILLARY ONLY
Bacterial Vaginitis (gardnerella): NEGATIVE
Candida Glabrata: NEGATIVE
Candida Vaginitis: NEGATIVE
Chlamydia: POSITIVE — AB
Comment: NEGATIVE
Comment: NEGATIVE
Comment: NEGATIVE
Comment: NEGATIVE
Comment: NEGATIVE
Comment: NORMAL
Neisseria Gonorrhea: NEGATIVE
Trichomonas: NEGATIVE

## 2022-07-22 MED ORDER — DOXYCYCLINE HYCLATE 100 MG PO TABS: 100.0000 mg | ORAL_TABLET | Freq: Two times a day (BID) | ORAL | 0 refills | Status: DC

## 2022-07-22 MED ORDER — DOXYCYCLINE CALCIUM 50 MG/5ML PO SYRP: 100.0000 mg | ORAL_SOLUTION | Freq: Two times a day (BID) | ORAL | 0 refills | Status: DC

## 2022-07-22 NOTE — Addendum Note (Signed)
Addended by: Farrel Conners on: 07/22/2022 12:49 PM   Modules accepted: Orders

## 2022-07-22 NOTE — Telephone Encounter (Signed)
Pt called to FU on her request for the liquid version of this medication

## 2022-07-22 NOTE — Telephone Encounter (Signed)
Pt has trouble swallowing pills  and would like to know if doxycycline (VIBRA-TABS) 100 MG tablet  comes in liquid form  [id number] - , [place] - [address] Phone: [phone]  Fax: [phone]

## 2022-07-22 NOTE — Telephone Encounter (Signed)
Rx sent 

## 2022-07-23 NOTE — Telephone Encounter (Signed)
I tried to call the [facility] to find out what is going on with this prescription but I never got past being on hold-- could you try for me? The patient requested the liquid doxycycline apparently and I don't know which rx to call in for her

## 2022-07-23 NOTE — Telephone Encounter (Signed)
Per Donah Driver the pharmacist, she was able to find this and has liquid Doxycycline 32m/5ml please send the Rx for 23m5ml-patient would need [id number] she will do the conversions.  Message sent to PCP.

## 2022-07-30 ENCOUNTER — Other Ambulatory Visit (HOSPITAL_COMMUNITY)
Admission: RE | Admit: 2022-07-30 | Discharge: 2022-07-30 | Disposition: A | Discharge status: Home / Self Care | Payer: Medicaid Other | Source: Ambulatory Visit | Attending: Family Medicine | Admitting: Family Medicine

## 2022-07-30 ENCOUNTER — Ambulatory Visit (INDEPENDENT_AMBULATORY_CARE_PROVIDER_SITE_OTHER): Payer: Medicaid Other | Admitting: Family Medicine

## 2022-07-30 ENCOUNTER — Encounter: Payer: Self-pay | Admitting: Family Medicine

## 2022-07-30 VITALS — BP 90/58 | HR 66 | Temp 98.7°F | Ht 64.5 in | Wt 144.6 lb

## 2022-07-30 DIAGNOSIS — A749 Chlamydial infection, unspecified: Secondary | ICD-10-CM | POA: Diagnosis present

## 2022-07-30 NOTE — Progress Notes (Signed)
   Established Patient Office Visit  Subjective   Patient ID: Emily Lucas, female    DOB: [birth year]  Age: 22 y.o. MRN: [id number]  Chief Complaint  Patient presents with   SEXUALLY TRANSMITTED DISEASE    Patient presents for follow up on STD, states she finished the medication    Pt took all 7 days of the doxycycline, states she didn't notice any changes in vaginal discharge or anything, didn't really have any symptoms before. Patient finished her antibiotics yesterday. She reports that her partner also had his treatment. No fever/chills, no pelvic pain. No other issues.       Review of Systems  All other systems reviewed and are negative.     Objective:     BP (!) 90/58 (BP Location: Left Arm, Patient Position: Sitting, Cuff Size: Normal)   Pulse 66   Temp 98.7 F (37.1 C) (Oral)   Ht 5' 4.5" (1.638 m)   Wt 144 lb 9.6 oz (65.6 kg)   SpO2 100%   BMI 24.44 kg/m    Physical Exam Vitals reviewed.  Constitutional:      Appearance: Normal appearance. She is normal weight.  Eyes:     Conjunctiva/sclera: Conjunctivae normal.  Pulmonary:     Effort: Pulmonary effort is normal.  Neurological:     Mental Status: She is alert and oriented to person, place, and time. Mental status is at baseline.  Psychiatric:        Mood and Affect: Mood normal.        Behavior: Behavior normal.      No results found for any visits on 07/30/22.    The ASCVD Risk score (Arnett DK, et al., 2019) failed to calculate for the following reasons:   The 2019 ASCVD risk score is only valid for ages 89 to 9    Assessment & Plan:   Problem List Items Addressed This Visit   None Visit Diagnoses     Chlamydia infection    -  Primary   Relevant Orders   Cervicovaginal ancillary only     Swab will be repeated today to ensure resolution. I will see her back in  April for her pap smear and will reswab her again at that time. No follow-ups on file.    Farrel Conners, MD

## 2022-08-02 LAB — CERVICOVAGINAL ANCILLARY ONLY
Bacterial Vaginitis (gardnerella): NEGATIVE
Candida Glabrata: NEGATIVE
Candida Vaginitis: POSITIVE — AB
Chlamydia: NEGATIVE
Comment: NEGATIVE
Comment: NEGATIVE
Comment: NEGATIVE
Comment: NEGATIVE
Comment: NORMAL
Neisseria Gonorrhea: NEGATIVE

## 2022-08-04 ENCOUNTER — Ambulatory Visit: Payer: Self-pay

## 2022-08-05 ENCOUNTER — Ambulatory Visit: Payer: Medicaid Other | Admitting: Family Medicine

## 2022-08-12 ENCOUNTER — Ambulatory Visit (INDEPENDENT_AMBULATORY_CARE_PROVIDER_SITE_OTHER): Payer: Medicaid Other | Admitting: Family Medicine

## 2022-08-12 ENCOUNTER — Other Ambulatory Visit (HOSPITAL_COMMUNITY)
Admission: RE | Admit: 2022-08-12 | Discharge: 2022-08-12 | Disposition: A | Discharge status: Home / Self Care | Payer: Medicaid Other | Source: Ambulatory Visit | Attending: Family Medicine | Admitting: Family Medicine

## 2022-08-12 ENCOUNTER — Encounter: Payer: Self-pay | Admitting: Family Medicine

## 2022-08-12 VITALS — BP 92/58 | HR 83 | Temp 97.8°F | Ht 64.5 in | Wt 147.0 lb

## 2022-08-12 DIAGNOSIS — B9689 Other specified bacterial agents as the cause of diseases classified elsewhere: Secondary | ICD-10-CM | POA: Insufficient documentation

## 2022-08-12 DIAGNOSIS — N760 Acute vaginitis: Secondary | ICD-10-CM | POA: Insufficient documentation

## 2022-08-12 MED ORDER — METRONIDAZOLE 500 MG PO TABS: 500.0000 mg | ORAL_TABLET | Freq: Two times a day (BID) | ORAL | 0 refills | Status: DC

## 2022-08-12 NOTE — Progress Notes (Signed)
   Acute Office Visit  Subjective:     Patient ID: Emily Lucas, female    DOB: [birth year], 22 y.o.   MRN: [id number]  Chief Complaint  Patient presents with   Vaginal Discharge    Patient complains of odorous vaginal discharge x6 days    Vaginal Discharge The patient's primary symptoms include vaginal discharge. Pertinent negatives include no abdominal pain, chills, diarrhea, dysuria or fever.   Patient is in today for whitish clear discharge for the past 6 days. States that she took some flagyl at home from a previous infection. States that it did help a little. There is no itching  or skin irritation at this time.   Review of Systems  Constitutional:  Negative for chills and fever.  Gastrointestinal:  Negative for abdominal pain and diarrhea.  Genitourinary:  Positive for vaginal discharge. Negative for dysuria.        Objective:    BP (!) 92/58 (BP Location: Left Arm, Patient Position: Sitting, Cuff Size: Normal)   Pulse 83   Temp 97.8 F (36.6 C) (Oral)   Ht 5' 4.5" (1.638 m)   Wt 147 lb (66.7 kg)   SpO2 96%   BMI 24.84 kg/m    Physical Exam Vitals reviewed.  Constitutional:      Appearance: Normal appearance. She is normal weight.  Eyes:     Conjunctiva/sclera: Conjunctivae normal.  Pulmonary:     Effort: Pulmonary effort is normal.  Neurological:     General: No focal deficit present.     Mental Status: She is alert and oriented to person, place, and time. Mental status is at baseline.  Psychiatric:        Mood and Affect: Mood normal.        Behavior: Behavior normal.     No results found for any visits on 08/12/22.      Assessment & Plan:   Problem List Items Addressed This Visit   None Visit Diagnoses     Bacterial vaginitis    -  Primary   Relevant Medications   metroNIDAZOLE (FLAGYL) 500 MG tablet   Other Relevant Orders   Cervicovaginal ancillary only     Most likely diagnosis, however will confirm with nuswab testing again  today. Will rx metronidazole empirically for now  Meds ordered this encounter  Medications   metroNIDAZOLE (FLAGYL) 500 MG tablet    Sig: Take 1 tablet (500 mg total) by mouth 2 (two) times daily for 7 days.    Dispense:  14 tablet    Refill:  0    No follow-ups on file.  Farrel Conners, MD

## 2022-08-17 LAB — CERVICOVAGINAL ANCILLARY ONLY
Bacterial Vaginitis (gardnerella): NEGATIVE
Candida Glabrata: NEGATIVE
Candida Vaginitis: NEGATIVE
Chlamydia: NEGATIVE
Comment: NEGATIVE
Comment: NEGATIVE
Comment: NEGATIVE
Comment: NEGATIVE
Comment: NEGATIVE
Comment: NORMAL
Neisseria Gonorrhea: NEGATIVE
Trichomonas: NEGATIVE

## 2022-08-20 ENCOUNTER — Ambulatory Visit: Payer: Medicaid Other | Admitting: Family Medicine

## 2022-08-31 ENCOUNTER — Ambulatory Visit (INDEPENDENT_AMBULATORY_CARE_PROVIDER_SITE_OTHER): Payer: Medicaid Other | Admitting: Family Medicine

## 2022-08-31 ENCOUNTER — Other Ambulatory Visit (HOSPITAL_COMMUNITY)
Admission: RE | Admit: 2022-08-31 | Discharge: 2022-08-31 | Disposition: A | Discharge status: Home / Self Care | Payer: Medicaid Other | Source: Ambulatory Visit | Attending: Family Medicine | Admitting: Family Medicine

## 2022-08-31 ENCOUNTER — Encounter: Payer: Self-pay | Admitting: Family Medicine

## 2022-08-31 VITALS — BP 100/60 | HR 80 | Temp 98.8°F | Ht 64.5 in | Wt 143.9 lb

## 2022-08-31 DIAGNOSIS — Z7251 High risk heterosexual behavior: Secondary | ICD-10-CM | POA: Insufficient documentation

## 2022-08-31 NOTE — Progress Notes (Signed)
   Established Patient Office Visit  Subjective   Patient ID: Emily Lucas, female    DOB: 2000-11-07  Age: 22 y.o. MRN: [id number]  Chief Complaint  Patient presents with   Results    Patient requests repeat STD testing    Pt is here for repeat STI testing, she reports that her partner did get treated also and she is not having any signs of vaginal symptoms, no pelvic pain, no fever/chills. Reports she finished the flagyl from previous, the vaginal discharge has resolved.       Review of Systems  All other systems reviewed and are negative.     Objective:     BP 100/60 (BP Location: Left Arm, Patient Position: Sitting, Cuff Size: Normal)   Pulse 80   Temp 98.8 F (37.1 C) (Oral)   Ht 5' 4.5" (1.638 m)   Wt 143 lb 14.4 oz (65.3 kg)   SpO2 100%   BMI 24.32 kg/m    Physical Exam Vitals reviewed.  Constitutional:      Appearance: Normal appearance. She is normal weight.  Eyes:     Conjunctiva/sclera: Conjunctivae normal.  Pulmonary:     Effort: Pulmonary effort is normal.  Neurological:     Mental Status: She is alert and oriented to person, place, and time. Mental status is at baseline.  Psychiatric:        Mood and Affect: Mood normal.        Behavior: Behavior normal.      No results found for any visits on 08/31/22.    The ASCVD Risk score (Arnett DK, et al., 2019) failed to calculate for the following reasons:   The 2019 ASCVD risk score is only valid for ages 30 to 28    Assessment & Plan:   Problem List Items Addressed This Visit   None Visit Diagnoses     Unprotected sexual intercourse    -  Primary   Relevant Orders   No symptoms currently per patient's report, will recheck vaginal swab to ensure there was no reinfection.   Cervicovaginal ancillary only       No follow-ups on file.    Farrel Conners, MD

## 2022-09-01 LAB — CERVICOVAGINAL ANCILLARY ONLY
Bacterial Vaginitis (gardnerella): POSITIVE — AB
Candida Glabrata: NEGATIVE
Candida Vaginitis: NEGATIVE
Chlamydia: NEGATIVE
Comment: NEGATIVE
Comment: NEGATIVE
Comment: NEGATIVE
Comment: NEGATIVE
Comment: NEGATIVE
Comment: NORMAL
Neisseria Gonorrhea: NEGATIVE
Trichomonas: NEGATIVE

## 2022-09-02 ENCOUNTER — Other Ambulatory Visit: Payer: Self-pay | Admitting: Family Medicine

## 2022-09-02 DIAGNOSIS — N760 Acute vaginitis: Secondary | ICD-10-CM

## 2022-09-02 MED ORDER — METRONIDAZOLE 500 MG PO TABS: 500.0000 mg | ORAL_TABLET | Freq: Two times a day (BID) | ORAL | 0 refills | Status: AC

## 2022-09-13 ENCOUNTER — Encounter: Payer: Self-pay | Admitting: Family Medicine

## 2022-09-13 DIAGNOSIS — N760 Acute vaginitis: Secondary | ICD-10-CM

## 2022-09-13 MED ORDER — METRONIDAZOLE 0.75 % VA GEL: 1.0000 | Freq: Every day | VAGINAL | 0 refills | Status: AC

## 2022-09-29 ENCOUNTER — Telehealth: Payer: Self-pay | Admitting: Family Medicine

## 2022-09-29 ENCOUNTER — Ambulatory Visit: Payer: Medicaid Other | Admitting: Family Medicine

## 2022-09-29 NOTE — Telephone Encounter (Signed)
Left vm for pt to call back to r/s appt due to provider being out of the office.         

## 2022-09-30 ENCOUNTER — Telehealth: Payer: Self-pay | Admitting: Family Medicine

## 2022-09-30 NOTE — Telephone Encounter (Signed)
Left vm for pt to call back to r/s appt due to provider being out of the office.         

## 2022-10-01 ENCOUNTER — Telehealth: Payer: Self-pay | Admitting: Family Medicine

## 2022-10-01 NOTE — Telephone Encounter (Signed)
Left vm for pt to call back to r/s appt due to provider being out of the office.         

## 2022-10-04 ENCOUNTER — Encounter: Payer: Self-pay | Admitting: Family Medicine

## 2022-10-04 ENCOUNTER — Ambulatory Visit (INDEPENDENT_AMBULATORY_CARE_PROVIDER_SITE_OTHER): Payer: Medicaid Other | Admitting: Family Medicine

## 2022-10-04 ENCOUNTER — Other Ambulatory Visit (HOSPITAL_COMMUNITY)
Admission: RE | Admit: 2022-10-04 | Discharge: 2022-10-04 | Disposition: A | Discharge status: Home / Self Care | Payer: Medicaid Other | Source: Ambulatory Visit | Attending: Family Medicine | Admitting: Family Medicine

## 2022-10-04 VITALS — BP 84/50 | HR 70 | Temp 98.4°F | Ht 64.5 in | Wt 146.1 lb

## 2022-10-04 DIAGNOSIS — N898 Other specified noninflammatory disorders of vagina: Secondary | ICD-10-CM | POA: Insufficient documentation

## 2022-10-04 DIAGNOSIS — L2082 Flexural eczema: Secondary | ICD-10-CM | POA: Diagnosis not present

## 2022-10-04 DIAGNOSIS — N760 Acute vaginitis: Secondary | ICD-10-CM

## 2022-10-04 MED ORDER — TRIAMCINOLONE ACETONIDE 0.1 % EX CREA: 1.0000 | TOPICAL_CREAM | Freq: Two times a day (BID) | CUTANEOUS | 5 refills | Status: DC

## 2022-10-04 NOTE — Progress Notes (Signed)
   Acute Office Visit  Subjective:     Patient ID: Emily Lucas, female    DOB: 03-10-2001, 22 y.o.   MRN: [id number]  Chief Complaint  Patient presents with   Vaginal Discharge    Patient complains of yellow, odorous  vaginal discharge x2 days    Vaginal Discharge The patient's primary symptoms include vaginal discharge. The patient's pertinent negatives include no genital itching, genital lesions, genital rash, pelvic pain or vaginal bleeding. This is a recurrent problem. The current episode started in the past 7 days. The problem occurs 2 to 4 times per day. The problem has been unchanged. The patient is experiencing no pain. She is not pregnant. Pertinent negatives include no abdominal pain or dysuria. The vaginal discharge was yellow and watery. There has been no bleeding. Nothing aggravates the symptoms. She has tried nothing for the symptoms. She is sexually active. No, her partner does not have an STD.   Patient is in today for yellowish discharge, states that she usually has BV, no fever/chills, no pelvic pain or bleeding, no rash or new lesions.   Review of Systems  Gastrointestinal:  Negative for abdominal pain.  Genitourinary:  Positive for vaginal discharge. Negative for dysuria and pelvic pain.  All other systems reviewed and are negative.       Objective:    BP (!) 84/50 (BP Location: Left Arm, Patient Position: Sitting, Cuff Size: Normal)   Pulse 70   Temp 98.4 F (36.9 C) (Oral)   Ht 5' 4.5" (1.638 m)   Wt 146 lb 1.6 oz (66.3 kg)   SpO2 100%   BMI 24.69 kg/m    Physical Exam Vitals reviewed.  Constitutional:      Appearance: Normal appearance. She is normal weight.  Eyes:     Conjunctiva/sclera: Conjunctivae normal.  Pulmonary:     Effort: Pulmonary effort is normal.  Skin:    General: Skin is warm and dry.     Findings: No rash.  Neurological:     Mental Status: She is alert and oriented to person, place, and time. Mental status is at baseline.   Psychiatric:        Mood and Affect: Mood normal.        Behavior: Behavior normal.     No results found for any visits on 10/04/22.      Assessment & Plan:   Problem List Items Addressed This Visit   None Visit Diagnoses     Vaginal discharge    -  Primary   Relevant Orders   Pt has a history of recurrent BV, however this time there is yellowish discharge that is usually not present. She was just treated for BV 1 month ago. Will retest with nuswab today.   Cervicovaginal ancillary only   Flexural eczema       Relevant Medications   Pt requesting refills on her kenalog cream for her eczema, states it is working well her her rashes.  triamcinolone cream (KENALOG) 0.1 %       Meds ordered this encounter  Medications   triamcinolone cream (KENALOG) 0.1 %    Sig: Apply 1 Application topically 2 (two) times daily.    Dispense:  453.6 g    Refill:  5    No follow-ups on file.  Karie Georges, MD

## 2022-10-06 ENCOUNTER — Ambulatory Visit: Payer: Medicaid Other | Admitting: Family Medicine

## 2022-10-06 LAB — CERVICOVAGINAL ANCILLARY ONLY
Bacterial Vaginitis (gardnerella): POSITIVE — AB
Candida Glabrata: NEGATIVE
Candida Vaginitis: NEGATIVE
Chlamydia: NEGATIVE
Comment: NEGATIVE
Comment: NEGATIVE
Comment: NEGATIVE
Comment: NEGATIVE
Comment: NEGATIVE
Comment: NORMAL
Neisseria Gonorrhea: NEGATIVE
Trichomonas: NEGATIVE

## 2022-10-06 MED ORDER — METRONIDAZOLE 0.75 % EX GEL: 1.0000 | Freq: Two times a day (BID) | CUTANEOUS | 0 refills | Status: DC

## 2022-10-06 NOTE — Addendum Note (Signed)
Addended by: Karie Georges on: 10/06/2022 01:17 PM   Modules accepted: Orders

## 2022-11-04 ENCOUNTER — Other Ambulatory Visit (HOSPITAL_COMMUNITY)
Admission: RE | Admit: 2022-11-04 | Discharge: 2022-11-04 | Disposition: A | Discharge status: Home / Self Care | Payer: Medicaid Other | Source: Ambulatory Visit | Attending: Family Medicine | Admitting: Family Medicine

## 2022-11-04 ENCOUNTER — Encounter: Payer: Self-pay | Admitting: Family Medicine

## 2022-11-04 ENCOUNTER — Ambulatory Visit (INDEPENDENT_AMBULATORY_CARE_PROVIDER_SITE_OTHER): Payer: Medicaid Other | Admitting: Family Medicine

## 2022-11-04 VITALS — BP 108/60 | HR 75 | Temp 98.2°F | Ht 64.5 in | Wt 146.7 lb

## 2022-11-04 DIAGNOSIS — B9689 Other specified bacterial agents as the cause of diseases classified elsewhere: Secondary | ICD-10-CM | POA: Insufficient documentation

## 2022-11-04 DIAGNOSIS — N760 Acute vaginitis: Secondary | ICD-10-CM

## 2022-11-04 NOTE — Progress Notes (Signed)
   Acute Office Visit  Subjective:     Patient ID: Emily Lucas, female    DOB: 04/12/2001, 22 y.o.   MRN: [id number]  Chief Complaint  Patient presents with   Patient requests repeat STD testing, denies exposure    HPI Patient is in today for repeat testing, states she hasn't had any changes in vagina secretions, states she needs more of the vaginal gel-- states that every time she has intercourse or uses the wrong body wash it throws off her pH and she gets another bout of BV. We discussed possibly using the metronidazole gel once weekly to help reduce her risk, we will recheck her swab today and decide after if she needs a regular rx for the metronidazole.  Review of Systems  All other systems reviewed and are negative.       Objective:    BP 108/60 (BP Location: Left Arm, Patient Position: Sitting, Cuff Size: Normal)   Pulse 75   Temp 98.2 F (36.8 C) (Oral)   Ht 5' 4.5" (1.638 m)   Wt 146 lb 11.2 oz (66.5 kg)   LMP 10/28/2022 (Approximate)   SpO2 98%   BMI 24.79 kg/m    Physical Exam Constitutional:      Appearance: Normal appearance. She is normal weight.  Eyes:     Conjunctiva/sclera: Conjunctivae normal.  Pulmonary:     Effort: Pulmonary effort is normal.  Skin:    General: Skin is warm and dry.  Neurological:     Mental Status: She is alert and oriented to person, place, and time. Mental status is at baseline.  Psychiatric:        Mood and Affect: Mood normal.        Behavior: Behavior normal.     No results found for any visits on 11/04/22.      Assessment & Plan:   Problem List Items Addressed This Visit   None Visit Diagnoses     Bacterial vaginitis    -  Primary   Relevant Orders   Cervicovaginal ancillary only     Swab sent to lab for analysis, will give recommendations based on the results.   No orders of the defined types were placed in this encounter.   No follow-ups on file.  Karie Georges, MD

## 2022-11-09 LAB — CERVICOVAGINAL ANCILLARY ONLY
Bacterial Vaginitis (gardnerella): NEGATIVE
Candida Glabrata: NEGATIVE
Candida Vaginitis: NEGATIVE
Chlamydia: NEGATIVE
Comment: NEGATIVE
Comment: NEGATIVE
Comment: NEGATIVE
Comment: NEGATIVE
Comment: NEGATIVE
Comment: NORMAL
Neisseria Gonorrhea: NEGATIVE
Trichomonas: NEGATIVE

## 2022-11-21 ENCOUNTER — Other Ambulatory Visit: Payer: Self-pay | Admitting: Family Medicine

## 2022-11-21 DIAGNOSIS — B9689 Other specified bacterial agents as the cause of diseases classified elsewhere: Secondary | ICD-10-CM

## 2022-11-22 ENCOUNTER — Telehealth: Payer: Self-pay | Admitting: Family Medicine

## 2022-11-22 NOTE — Telephone Encounter (Signed)
Spoke with the patient and informed her of the message below.  Patient stated she is having odorous vaginal discharge again and an appt was scheduled for 6/11.  Appt on 6/14 was cancelled and the patient is aware.

## 2022-11-22 NOTE — Telephone Encounter (Signed)
Patient states she needs a refill for Metro Gel for her bacteria infection.  She states that Dr. Casimiro Needle knows from her last visit that she spilled part of it and that is why she needs a refill.  She states she gets the bacteria infections all the time so that is why she needs the gel.  She has an appointment on 11/26/22 to see Dr. Casimiro Needle.    Patient is requesting a call back to let her know that it has been called in.

## 2022-11-22 NOTE — Telephone Encounter (Signed)
The last swab was negative--- is she having a return of her symptoms? I usually just try to treat as needed- instead of continuously

## 2022-11-23 ENCOUNTER — Encounter: Payer: Self-pay | Admitting: Family Medicine

## 2022-11-23 ENCOUNTER — Ambulatory Visit (INDEPENDENT_AMBULATORY_CARE_PROVIDER_SITE_OTHER): Payer: Medicaid Other | Admitting: Family Medicine

## 2022-11-23 ENCOUNTER — Other Ambulatory Visit (HOSPITAL_COMMUNITY)
Admission: RE | Admit: 2022-11-23 | Discharge: 2022-11-23 | Disposition: A | Discharge status: Home / Self Care | Payer: Medicaid Other | Source: Ambulatory Visit | Attending: Family Medicine | Admitting: Family Medicine

## 2022-11-23 VITALS — BP 100/58 | HR 75 | Temp 98.2°F | Ht 64.5 in | Wt 149.9 lb

## 2022-11-23 DIAGNOSIS — N611 Abscess of the breast and nipple: Secondary | ICD-10-CM | POA: Diagnosis not present

## 2022-11-23 DIAGNOSIS — N898 Other specified noninflammatory disorders of vagina: Secondary | ICD-10-CM

## 2022-11-23 DIAGNOSIS — B9689 Other specified bacterial agents as the cause of diseases classified elsewhere: Secondary | ICD-10-CM | POA: Diagnosis not present

## 2022-11-23 DIAGNOSIS — N760 Acute vaginitis: Secondary | ICD-10-CM | POA: Diagnosis not present

## 2022-11-23 NOTE — Progress Notes (Signed)
   Acute Office Visit  Subjective:     Patient ID: Emily Lucas, female    DOB: 06-25-2000, 22 y.o.   MRN: [id number]  Chief Complaint  Patient presents with   Patient complains of vaginal odor x2 days   Breast Problem    Patient complains of pimple right breast, itchy,flaky area noted with clear-white pus or drainage last week    Patient is reporting increase in vaginal odor. She thinks her BV is back, last swab was negative. She reports that the  symptoms return whenever she has intercourse with her SO. States that there is no discharge or discomfort.    Patient is in today for itchy flaking area on the right breast, noticed it about a week ago, states it burst open, drained some purulent fluid, then pain that were there went away. Reports that she still has an opening in the skin.   Review of Systems  All other systems reviewed and are negative.       Objective:    BP (!) 100/58 (BP Location: Left Arm, Patient Position: Sitting, Cuff Size: Normal)   Pulse 75   Temp 98.2 F (36.8 C) (Oral)   Ht 5' 4.5" (1.638 m)   Wt 149 lb 14.4 oz (68 kg)   LMP 10/28/2022 (Approximate)   BMI 25.33 kg/m    Physical Exam Constitutional:      Appearance: Normal appearance. She is normal weight.  Eyes:     Conjunctiva/sclera: Conjunctivae normal.  Pulmonary:     Effort: Pulmonary effort is normal.  Skin:    Findings: Lesion (right outer breast lesion, there is a small open area in the skin, there was no lump or redness seen, no induration or fluctuance, no purlent discharge expresseed from the area.) present.  Neurological:     General: No focal deficit present.     Mental Status: She is alert and oriented to person, place, and time. Mental status is at baseline.     No results found for any visits on 11/23/22.      Assessment & Plan:   Problem List Items Addressed This Visit   None Visit Diagnoses     Vaginal odor    -  Primary   Relevant Orders   Recurrent  infection, however the last swab last month was negative. Will check another swab today and treat if BV is present. We discussed that after treatment we could use the metronidazole twice a week for 4-6 months for prevention.  Cervicovaginal ancillary only   Abscess of breast         The abscess has already opened and drained, there is no swelling or redness, think that it is already starting to heal. Antibiotics not indicated at this time.   No orders of the defined types were placed in this encounter.   No follow-ups on file.  Karie Georges, MD

## 2022-11-24 LAB — CERVICOVAGINAL ANCILLARY ONLY
Bacterial Vaginitis (gardnerella): POSITIVE — AB
Candida Glabrata: NEGATIVE
Candida Vaginitis: NEGATIVE
Chlamydia: NEGATIVE
Comment: NEGATIVE
Comment: NEGATIVE
Comment: NEGATIVE
Comment: NEGATIVE
Comment: NEGATIVE
Comment: NORMAL
Neisseria Gonorrhea: NEGATIVE
Trichomonas: NEGATIVE

## 2022-11-25 MED ORDER — METRONIDAZOLE 0.75 % EX GEL
1.0000 | CUTANEOUS | 5 refills | Status: DC
Start: 2022-11-25 — End: 2023-01-27

## 2022-11-25 MED ORDER — METRONIDAZOLE 0.75 % VA GEL
1.0000 | Freq: Two times a day (BID) | VAGINAL | 0 refills | Status: DC
Start: 2022-11-25 — End: 2023-03-23

## 2022-11-25 NOTE — Addendum Note (Signed)
Addended by: Karie Georges on: 11/25/2022 08:15 AM   Modules accepted: Orders

## 2022-11-26 ENCOUNTER — Ambulatory Visit: Payer: Medicaid Other | Admitting: Family Medicine

## 2022-12-19 ENCOUNTER — Ambulatory Visit (HOSPITAL_COMMUNITY)
Admission: RE | Admit: 2022-12-19 | Discharge: 2022-12-19 | Disposition: A | Discharge status: Home / Self Care | Payer: Medicaid Other | Source: Ambulatory Visit | Attending: Emergency Medicine | Admitting: Emergency Medicine

## 2022-12-19 ENCOUNTER — Other Ambulatory Visit: Payer: Self-pay

## 2022-12-19 ENCOUNTER — Encounter (HOSPITAL_COMMUNITY): Payer: Self-pay

## 2022-12-19 ENCOUNTER — Other Ambulatory Visit: Payer: Self-pay | Admitting: Family Medicine

## 2022-12-19 VITALS — BP 109/69 | HR 79 | Temp 99.1°F | Resp 18

## 2022-12-19 DIAGNOSIS — A749 Chlamydial infection, unspecified: Secondary | ICD-10-CM

## 2022-12-19 DIAGNOSIS — L02412 Cutaneous abscess of left axilla: Secondary | ICD-10-CM | POA: Diagnosis not present

## 2022-12-19 MED ORDER — DOXYCYCLINE HYCLATE 100 MG PO CAPS: 100.0000 mg | ORAL_CAPSULE | Freq: Two times a day (BID) | ORAL | 0 refills | Status: DC

## 2022-12-19 MED ORDER — DOXYCYCLINE MONOHYDRATE 25 MG/5ML PO SUSR: 100.0000 mg | Freq: Two times a day (BID) | ORAL | 0 refills | Status: AC

## 2022-12-19 NOTE — ED Triage Notes (Signed)
Pt reports she has a abscess located in Lt axilla for a couple of days.

## 2022-12-19 NOTE — Discharge Instructions (Addendum)
Abscess is most likely from a hair bump, exfoliate prior to and after with a cheap rough textured washcloth and circular motions to help prevent in the future, abscess has been drained here in office  Take doxycycline every morning and every evening for 7 days.  Hold warm-hot compresses to affected area at least 4 times a day, this helps to facilitate draining, the more the better  Please return for evaluation for increased swelling, increased tenderness or pain, non healing site, non draining site, you begin to have fever or chills   We reviewed the etiology of recurrent abscesses of skin.  Skin abscesses are collections of pus within the dermis and deeper skin tissues. Skin abscesses manifest as painful, tender, fluctuant, and erythematous nodules, frequently surmounted by a pustule and surrounded by a rim of erythematous swelling.  Spontaneous drainage of purulent material may occur.  Fever can occur on occasion.    -Skin abscesses can develop in healthy individuals with no predisposing conditions other than skin or nasal carriage of Staphylococcus aureus.  Individuals in close contact with others who have active infection with skin abscesses are at increased risk which is likely to explain why twin brother has similar episodes.   In addition, any process leading to a breach in the skin barrier can also predispose to the development of a skin abscesses, such as atopic dermatitis.

## 2022-12-19 NOTE — ED Provider Notes (Signed)
[facility]    CSN: [id number] Arrival date & time: 12/19/22  1559      History   Chief Complaint Chief Complaint  Patient presents with   Laceration    I think I might have a cyst in arm pit - Entered by patient   Abscess    HPI Emily Lucas is a 22 y.o. female.   Patient presents for evaluation of abscess to the left axilla present for 7 days.  Has increased in size and become painful.  Has not attempted treatment.  Denies drainage or fevers.    Past Medical History:  Diagnosis Date   Gallstones    Learning disability    "comprehension"- mother reported    Patient Active Problem List   Diagnosis Date Noted   Acute blood loss anemia 01/26/2020   Postpartum care following vaginal delivery 8/13 01/25/2020   Maternal anemia, with delivery - IDA with superimposed ABL 01/25/2020   Normal labor 01/24/2020   Supervision of normal first teen pregnancy 01/24/2020   Chorioamnionitis in third trimester, fetus 1 01/24/2020   Abnormal cholangiogram    S/P laparoscopic cholecystectomy 01/25/2017    Past Surgical History:  Procedure Laterality Date   cholecystecomy  01/24/2017   CHOLECYSTECTOMY     ERCP N/A 02/01/2017   Procedure: ENDOSCOPIC RETROGRADE CHOLANGIOPANCREATOGRAPHY (ERCP);  Surgeon: Rachael Fee, MD;  Location: [facility] ENDOSCOPY;  Service: Endoscopy;  Laterality: N/A;    OB History     Gravida  1   Para  1   Term  1   Preterm  0   AB  0   Living  1      SAB  0   IAB  0   Ectopic  0   Multiple  0   Live Births  1            Home Medications    Prior to Admission medications   Medication Sig Start Date End Date Taking? Authorizing Provider  Etonogestrel (NEXPLANON [place]) Inject into the skin.   Yes [provider]  ketoconazole (NIZORAL) 2 % shampoo Apply 1 Application topically 2 (two) times a week. 03/25/22   Merrilee Jansky, MD  metroNIDAZOLE (METROGEL) 0.75 % gel Apply 1 Application topically 2 (two) times a  week. 11/25/22   Karie Georges, MD  Selenium Sulfide 2.3 % SHAM Shampoo daily with selenium sulfide shampoo. 03/22/22   LampteyBritta Mccreedy, MD  triamcinolone cream (KENALOG) 0.1 % Apply 1 Application topically 2 (two) times daily. 10/04/22   Karie Georges, MD  iron polysaccharides (FERREX 150) 150 MG capsule Take 1 capsule (150 mg total) by mouth daily. 01/27/20 04/30/20  June Leap, CNM    Family History Family History  Problem Relation Age of Onset   Healthy Mother    Healthy Father     Social History Social History   Tobacco Use   Smoking status: Never   Smokeless tobacco: Never  Vaping Use   Vaping Use: Never used  Substance Use Topics   Alcohol use: Never   Drug use: Never     Allergies   Motrin [ibuprofen]   Review of Systems Review of Systems   Physical Exam Triage Vital Signs ED Triage Vitals  Enc Vitals Group     BP 12/19/22 1612 109/69     Pulse Rate 12/19/22 1612 79     Resp 12/19/22 1612 18     Temp 12/19/22 1612 99.1 F (37.3 C)  Temp src --      SpO2 12/19/22 1612 98 %     Weight --      Height --      Head Circumference --      Peak Flow --      Pain Score 12/19/22 1610 5     Pain Loc --      Pain Edu? --      Excl. in GC? --    No data found.  Updated Vital Signs BP 109/69   Pulse 79   Temp 99.1 F (37.3 C)   Resp 18   LMP 12/14/2022   SpO2 98%   Visual Acuity Right Eye Distance:   Left Eye Distance:   Bilateral Distance:    Right Eye Near:   Left Eye Near:    Bilateral Near:     Physical Exam Constitutional:      Appearance: Normal appearance.  Eyes:     Extraocular Movements: Extraocular movements intact.  Pulmonary:     Effort: Pulmonary effort is normal.  Skin:    Comments: 2 x 3 abscess present to the center of the left axilla  Neurological:     Mental Status: She is alert and oriented to person, place, and time. Mental status is at baseline.      UC Treatments / Results  Labs (all labs  ordered are listed, but only abnormal results are displayed) Labs Reviewed - No data to display  EKG   Radiology No results found.  Procedures Incision and Drainage  Date/Time: 12/19/2022 5:32 PM  Performed by: Valinda Hoar, NP Authorized by: Valinda Hoar, NP   Consent:    Consent obtained:  Verbal   Consent given by:  Patient   Risks, benefits, and alternatives were discussed: yes     Risks discussed:  Incomplete drainage Universal protocol:    Patient identity confirmed:  Verbally with patient Location:    Type:  Abscess   Size:  2x3 Pre-procedure details:    Skin preparation:  Chlorhexidine with alcohol Anesthesia:    Anesthesia method:  Local infiltration   Local anesthetic:  Lidocaine 1% w/o epi Procedure type:    Complexity:  Simple Procedure details:    Incision types:  Single straight   Drainage:  Purulent and bloody   Wound treatment:  Wound left open Post-procedure details:    Procedure completion:  Tolerated  (including critical care time)  Medications Ordered in UC Medications - No data to display  Initial Impression / Assessment and Plan / UC Course  I have reviewed the triage vital signs and the nursing notes.  Pertinent labs & imaging results that were available during my care of the patient were reviewed by me and considered in my medical decision making (see chart for details).  Abscess left axilla  I&D completed, tolerated well, doxycycline prescribed, recommended warm compresses and over-the-counter analgesics for additional supportive care, may follow-up with urgent care for nonhealing site Final Clinical Impressions(s) / UC Diagnoses   Final diagnoses:  None   Discharge Instructions   None    ED Prescriptions   None    PDMP not reviewed this encounter.   Valinda Hoar, NP 12/19/22 [phone]

## 2022-12-22 ENCOUNTER — Other Ambulatory Visit: Payer: Self-pay | Admitting: Family Medicine

## 2022-12-22 DIAGNOSIS — L02419 Cutaneous abscess of limb, unspecified: Secondary | ICD-10-CM

## 2022-12-22 MED ORDER — SULFAMETHOXAZOLE-TRIMETHOPRIM 200-40 MG/5ML PO SUSP
20.0000 mL | Freq: Two times a day (BID) | ORAL | 0 refills | Status: AC
Start: 2022-12-22 — End: 2022-12-29

## 2023-01-04 ENCOUNTER — Encounter: Payer: Self-pay | Admitting: Family Medicine

## 2023-01-04 ENCOUNTER — Ambulatory Visit (INDEPENDENT_AMBULATORY_CARE_PROVIDER_SITE_OTHER): Payer: Medicaid Other | Admitting: Family Medicine

## 2023-01-04 ENCOUNTER — Other Ambulatory Visit (HOSPITAL_COMMUNITY)
Admission: RE | Admit: 2023-01-04 | Discharge: 2023-01-04 | Disposition: A | Discharge status: Home / Self Care | Payer: Medicaid Other | Source: Ambulatory Visit | Attending: Family Medicine | Admitting: Family Medicine

## 2023-01-04 VITALS — BP 90/60 | HR 75 | Temp 98.1°F | Ht 64.5 in | Wt 144.5 lb

## 2023-01-04 DIAGNOSIS — Z7251 High risk heterosexual behavior: Secondary | ICD-10-CM | POA: Insufficient documentation

## 2023-01-04 DIAGNOSIS — N760 Acute vaginitis: Secondary | ICD-10-CM | POA: Diagnosis present

## 2023-01-04 DIAGNOSIS — B9689 Other specified bacterial agents as the cause of diseases classified elsewhere: Secondary | ICD-10-CM | POA: Diagnosis present

## 2023-01-04 DIAGNOSIS — Z111 Encounter for screening for respiratory tuberculosis: Secondary | ICD-10-CM

## 2023-01-04 NOTE — Progress Notes (Signed)
   Acute Office Visit  Subjective:     Patient ID: Emily Lucas, female    DOB: 02/04/2001, 22 y.o.   MRN: [id number]  Chief Complaint  Patient presents with   Patient requests STD testing, denies exposure    tb   Immunizations    Patient requests TB testing per employer    HPI Patient is in today for retesting for STI, she denies any exposure at this time. She is taking the flagyl applications twice a week, states that it is working well. States that her symptoms are controlled, no vaginal discharge or odor, no pelvic pain or fever/chills.   Pt reports she also needs a PPD test for her work today.   Review of Systems  All other systems reviewed and are negative.       Objective:    BP 90/60 (BP Location: Left Arm, Patient Position: Sitting, Cuff Size: Normal)   Pulse 75   Temp 98.1 F (36.7 C) (Oral)   Ht 5' 4.5" (1.638 m)   Wt 144 lb 8 oz (65.5 kg)   LMP 12/14/2022   SpO2 99%   BMI 24.42 kg/m    Physical Exam Vitals reviewed.  Constitutional:      Appearance: Normal appearance. She is normal weight.  Pulmonary:     Effort: Pulmonary effort is normal.  Neurological:     Mental Status: She is alert and oriented to person, place, and time. Mental status is at baseline.  Psychiatric:        Mood and Affect: Mood normal.        Behavior: Behavior normal.     No results found for any visits on 01/04/23.      Assessment & Plan:   Problem List Items Addressed This Visit   None Visit Diagnoses     Unprotected sexual intercourse    -  Primary   Relevant Orders   Cervicovaginal ancillary only   Bacterial vaginitis       Relevant Orders   Cervicovaginal ancillary only   Screening-pulmonary TB       Relevant Orders   PPD     Patient would like to be screened again today to look for any residual infection, even though her symptoms have improved using the twice weekly suppressive therapy for her BV. She does have unprotected intercourse regularly. Will  retest today.   No orders of the defined types were placed in this encounter.   No follow-ups on file.  Karie Georges, MD

## 2023-01-05 LAB — CERVICOVAGINAL ANCILLARY ONLY
Bacterial Vaginitis (gardnerella): NEGATIVE
Candida Glabrata: NEGATIVE
Candida Vaginitis: NEGATIVE
Chlamydia: NEGATIVE
Comment: NEGATIVE
Comment: NEGATIVE
Comment: NEGATIVE
Comment: NEGATIVE
Comment: NEGATIVE
Comment: NORMAL
Neisseria Gonorrhea: NEGATIVE
Trichomonas: NEGATIVE

## 2023-01-06 ENCOUNTER — Ambulatory Visit: Payer: Medicaid Other

## 2023-01-06 LAB — TB SKIN TEST: TB Skin Test: NEGATIVE

## 2023-01-06 NOTE — Progress Notes (Signed)
PPD Reading Note  PPD read and results entered in Epic[employer].  Result: 0 mm induration.  Interpretation: Negative  If test not read within 48-72 hours of initial placement, patient advised to repeat in other arm 1-3 weeks after this test.  Allergic reaction: no

## 2023-01-20 ENCOUNTER — Encounter: Payer: Self-pay | Admitting: Adult Health

## 2023-01-20 ENCOUNTER — Ambulatory Visit (INDEPENDENT_AMBULATORY_CARE_PROVIDER_SITE_OTHER): Payer: Medicaid Other | Admitting: Adult Health

## 2023-01-20 ENCOUNTER — Other Ambulatory Visit (HOSPITAL_COMMUNITY)
Admission: RE | Admit: 2023-01-20 | Discharge: 2023-01-20 | Disposition: A | Discharge status: Home / Self Care | Payer: Medicaid Other | Source: Ambulatory Visit | Attending: Adult Health | Admitting: Adult Health

## 2023-01-20 VITALS — BP 90/60 | HR 79 | Temp 98.5°F | Ht 64.5 in | Wt 148.0 lb

## 2023-01-20 DIAGNOSIS — Z113 Encounter for screening for infections with a predominantly sexual mode of transmission: Secondary | ICD-10-CM | POA: Diagnosis present

## 2023-01-20 DIAGNOSIS — Z7251 High risk heterosexual behavior: Secondary | ICD-10-CM

## 2023-01-20 DIAGNOSIS — Z723 Lack of physical exercise: Secondary | ICD-10-CM | POA: Diagnosis present

## 2023-01-20 NOTE — Progress Notes (Signed)
Subjective:    Patient ID: Emily Lucas, female    DOB: 2000/12/17, 22 y.o.   MRN: [id number]  HPI  22 year old female who  has a past medical history of Gallstones and Learning disability.  She is a patient of Dr. Casimiro Needle who I am seeing today for STD testing. She has recently had STD testing done ( on numerous occasions over the last year)  which was negative except for BV. Marland Kitchen She has a history of BV ad was recently treated with Flagyl. She states that she does not have any symptoms, states " This is just something that I do". She has had unprotected sex recently, last time being 2 days ago   Review of Systems See HPI   Past Medical History:  Diagnosis Date   Gallstones    Learning disability    "comprehension"- mother reported    Social History   Socioeconomic History   Marital status: Single    Spouse name: Not on file   Number of children: Not on file   Years of education: Not on file   Highest education level: 12th grade  Occupational History   Not on file  Tobacco Use   Smoking status: Never   Smokeless tobacco: Never  Vaping Use   Vaping status: Never Used  Substance and Sexual Activity   Alcohol use: Never   Drug use: Never   Sexual activity: Yes    Birth control/protection: Implant  Other Topics Concern   Not on file  Social History Narrative   Not on file   Social Determinants of Health   Financial Resource Strain: Medium Risk (07/20/2022)   Overall Financial Resource Strain (CARDIA)    Difficulty of Paying Living Expenses: Somewhat hard  Food Insecurity: Patient Declined (07/20/2022)   Hunger Vital Sign    Worried About Running Out of Food in the Last Year: Patient declined    Ran Out of Food in the Last Year: Patient declined  Transportation Needs: No Transportation Needs (07/20/2022)   PRAPARE - Administrator, Civil Service (Medical): No    Lack of Transportation (Non-Medical): No  Physical Activity: Unknown (07/20/2022)   Exercise  Vital Sign    Days of Exercise per Week: Patient declined    Minutes of Exercise per Session: Not on file  Stress: No Stress Concern Present (07/20/2022)   [employer] of Occupational Health - Occupational Stress Questionnaire    Feeling of Stress : Not at all  Social Connections: Unknown (07/20/2022)   Social Connection and Isolation Panel [NHANES]    Frequency of Communication with Friends and Family: Three times a week    Frequency of Social Gatherings with Friends and Family: Twice a week    Attends Religious Services: Patient declined    Active Member of Clubs or Organizations: No    Attends Banker Meetings: Not on file    Marital Status: Patient declined  Intimate Partner Violence: Not on file    Past Surgical History:  Procedure Laterality Date   cholecystecomy  01/24/2017   CHOLECYSTECTOMY     ERCP N/A 02/01/2017   Procedure: ENDOSCOPIC RETROGRADE CHOLANGIOPANCREATOGRAPHY (ERCP);  Surgeon: Rachael Fee, MD;  Location: [facility] ENDOSCOPY;  Service: Endoscopy;  Laterality: N/A;    Family History  Problem Relation Age of Onset   Healthy Mother    Healthy Father     Allergies  Allergen Reactions   Motrin [Ibuprofen] Hives    Current Outpatient Medications on  File Prior to Visit  Medication Sig Dispense Refill   Etonogestrel (NEXPLANON [place]) Inject into the skin.     ketoconazole (NIZORAL) 2 % shampoo Apply 1 Application topically 2 (two) times a week. 120 mL 0   metroNIDAZOLE (METROGEL) 0.75 % gel Apply 1 Application topically 2 (two) times a week. 45 g 5   Selenium Sulfide 2.3 % SHAM Shampoo daily with selenium sulfide shampoo. 180 mL 1   triamcinolone cream (KENALOG) 0.1 % Apply 1 Application topically 2 (two) times daily. 453.6 g 5   [DISCONTINUED] iron polysaccharides (FERREX 150) 150 MG capsule Take 1 capsule (150 mg total) by mouth daily. 30 capsule 1   No current facility-administered medications on file prior to visit.    There were no vitals  taken for this visit.      Objective:   Physical Exam Vitals and nursing note reviewed.  Constitutional:      Appearance: Normal appearance.  Musculoskeletal:        General: Normal range of motion.  Skin:    General: Skin is warm and dry.  Neurological:     General: No focal deficit present.     Mental Status: She is alert and oriented to person, place, and time.  Psychiatric:        Mood and Affect: Mood normal.        Behavior: Behavior normal.        Thought Content: Thought content normal.        Judgment: Judgment normal.        Assessment & Plan:  1. Screening examination for STD (sexually transmitted disease) - Encouraged to use condoms  - Cervicovaginal ancillary only  2. Unprotected sexual intercourse  - Cervicovaginal ancillary only   Shirline Frees, NP

## 2023-01-25 ENCOUNTER — Other Ambulatory Visit: Payer: Self-pay | Admitting: Adult Health

## 2023-01-25 DIAGNOSIS — A749 Chlamydial infection, unspecified: Secondary | ICD-10-CM

## 2023-01-25 MED ORDER — DOXYCYCLINE HYCLATE 100 MG PO CAPS
100.0000 mg | ORAL_CAPSULE | Freq: Two times a day (BID) | ORAL | 0 refills | Status: DC
Start: 2023-01-25 — End: 2023-02-10

## 2023-01-25 MED ORDER — CLINDAMYCIN PHOSPHATE 2 % VA CREA: 1.0000 | TOPICAL_CREAM | Freq: Every day | VAGINAL | 0 refills | Status: DC

## 2023-01-25 MED ORDER — FLUCONAZOLE 150 MG PO TABS: 150.0000 mg | ORAL_TABLET | Freq: Once | ORAL | 1 refills | Status: AC

## 2023-01-26 ENCOUNTER — Other Ambulatory Visit: Payer: Self-pay | Admitting: Adult Health

## 2023-01-26 MED ORDER — METRONIDAZOLE 500 MG PO TABS: 500.0000 mg | ORAL_TABLET | Freq: Three times a day (TID) | ORAL | 0 refills | Status: AC

## 2023-01-26 NOTE — Telephone Encounter (Signed)
Looking for an alternative. Previous Rx not covered.

## 2023-01-27 ENCOUNTER — Other Ambulatory Visit: Payer: Self-pay | Admitting: Adult Health

## 2023-01-27 ENCOUNTER — Telehealth: Payer: Self-pay | Admitting: Family Medicine

## 2023-01-27 MED ORDER — METRONIDAZOLE 0.75 % EX GEL: CUTANEOUS | 0 refills | Status: DC

## 2023-01-27 NOTE — Telephone Encounter (Signed)
Called pt no answer °

## 2023-01-27 NOTE — Telephone Encounter (Signed)
Pt called to say she cannot swallow pills and would like an Rx for "the gel"

## 2023-01-27 NOTE — Telephone Encounter (Signed)
ERROR PLEASE DISREGARD

## 2023-01-27 NOTE — Telephone Encounter (Signed)
Spoke to pt and she stated she informed someone that she is willing to pay the $40 for the gel. Looks like gel was already called in. No further action needed.

## 2023-02-08 ENCOUNTER — Ambulatory Visit: Payer: Medicaid Other | Admitting: Adult Health

## 2023-02-09 ENCOUNTER — Ambulatory Visit: Payer: Medicaid Other | Admitting: Family Medicine

## 2023-02-10 ENCOUNTER — Encounter: Payer: Self-pay | Admitting: Adult Health

## 2023-02-10 ENCOUNTER — Other Ambulatory Visit (HOSPITAL_COMMUNITY)
Admission: RE | Admit: 2023-02-10 | Discharge: 2023-02-10 | Disposition: A | Discharge status: Home / Self Care | Payer: Medicaid Other | Source: Ambulatory Visit | Attending: Adult Health | Admitting: Adult Health

## 2023-02-10 ENCOUNTER — Ambulatory Visit (INDEPENDENT_AMBULATORY_CARE_PROVIDER_SITE_OTHER): Payer: Medicaid Other | Admitting: Adult Health

## 2023-02-10 VITALS — BP 90/80 | HR 76 | Temp 98.4°F | Ht 64.5 in | Wt 148.0 lb

## 2023-02-10 DIAGNOSIS — Z113 Encounter for screening for infections with a predominantly sexual mode of transmission: Secondary | ICD-10-CM | POA: Diagnosis present

## 2023-02-10 NOTE — Progress Notes (Signed)
Subjective:    Patient ID: Emily Lucas, female    DOB: 2001-03-14, 22 y.o.   MRN: [id number]  HPI  22 year old female who  has a past medical history of Gallstones and Learning disability.  She presents to the office today for follow up. She was last seen about 2.5 weeks ago for STD testing. She tested positive for BV and Chlamydia. She was treated with Doxy and Metrogel and completed therapy. She denies symptoms. She has not had unprotected sex since starting treatment. She would like to be retested.   Review of Systems See HPI   Past Medical History:  Diagnosis Date   Gallstones    Learning disability    "comprehension"- mother reported    Social History   Socioeconomic History   Marital status: Single    Spouse name: Not on file   Number of children: Not on file   Years of education: Not on file   Highest education level: 12th grade  Occupational History   Not on file  Tobacco Use   Smoking status: Never   Smokeless tobacco: Never  Vaping Use   Vaping status: Never Used  Substance and Sexual Activity   Alcohol use: Never   Drug use: Never   Sexual activity: Yes    Birth control/protection: Implant  Other Topics Concern   Not on file  Social History Narrative   Not on file   Social Determinants of Health   Financial Resource Strain: Medium Risk (07/20/2022)   Overall Financial Resource Strain (CARDIA)    Difficulty of Paying Living Expenses: Somewhat hard  Food Insecurity: Patient Declined (07/20/2022)   Hunger Vital Sign    Worried About Running Out of Food in the Last Year: Patient declined    Ran Out of Food in the Last Year: Patient declined  Transportation Needs: No Transportation Needs (07/20/2022)   PRAPARE - Administrator, Civil Service (Medical): No    Lack of Transportation (Non-Medical): No  Physical Activity: Unknown (07/20/2022)   Exercise Vital Sign    Days of Exercise per Week: Patient declined    Minutes of Exercise per Session:  Not on file  Stress: No Stress Concern Present (07/20/2022)   [employer] of Occupational Health - Occupational Stress Questionnaire    Feeling of Stress : Not at all  Social Connections: Unknown (07/20/2022)   Social Connection and Isolation Panel [NHANES]    Frequency of Communication with Friends and Family: Three times a week    Frequency of Social Gatherings with Friends and Family: Twice a week    Attends Religious Services: Patient declined    Active Member of Clubs or Organizations: No    Attends Banker Meetings: Not on file    Marital Status: Patient declined  Intimate Partner Violence: Not on file    Past Surgical History:  Procedure Laterality Date   cholecystecomy  01/24/2017   CHOLECYSTECTOMY     ERCP N/A 02/01/2017   Procedure: ENDOSCOPIC RETROGRADE CHOLANGIOPANCREATOGRAPHY (ERCP);  Surgeon: Rachael Fee, MD;  Location: [facility] ENDOSCOPY;  Service: Endoscopy;  Laterality: N/A;    Family History  Problem Relation Age of Onset   Healthy Mother    Healthy Father     Allergies  Allergen Reactions   Motrin [Ibuprofen] Hives    Current Outpatient Medications on File Prior to Visit  Medication Sig Dispense Refill   Etonogestrel (NEXPLANON [place]) Inject into the skin.     ketoconazole (NIZORAL) 2 %  shampoo Apply 1 Application topically 2 (two) times a week. 120 mL 0   metroNIDAZOLE (METROGEL) 0.75 % gel Insert one applicatorful (5g) of medicine into the vagina once nightly x 5 days 25 g 0   Selenium Sulfide 2.3 % SHAM Shampoo daily with selenium sulfide shampoo. 180 mL 1   triamcinolone cream (KENALOG) 0.1 % Apply 1 Application topically 2 (two) times daily. 453.6 g 5   doxycycline (VIBRAMYCIN) 100 MG capsule Take 1 capsule (100 mg total) by mouth 2 (two) times daily. 14 capsule 0   [DISCONTINUED] iron polysaccharides (FERREX 150) 150 MG capsule Take 1 capsule (150 mg total) by mouth daily. 30 capsule 1   No current facility-administered medications on  file prior to visit.    BP 90/80   Pulse 76   Temp 98.4 F (36.9 C) (Oral)   Ht 5' 4.5" (1.638 m)   Wt 148 lb (67.1 kg)   SpO2 98%   BMI 25.01 kg/m       Objective:   Physical Exam Vitals and nursing note reviewed.  Constitutional:      Appearance: Normal appearance.  Cardiovascular:     Rate and Rhythm: Normal rate and regular rhythm.     Pulses: Normal pulses.     Heart sounds: Normal heart sounds.  Pulmonary:     Effort: Pulmonary effort is normal.     Breath sounds: Normal breath sounds.  Musculoskeletal:        General: Normal range of motion.  Skin:    General: Skin is warm and dry.  Neurological:     General: No focal deficit present.     Mental Status: She is alert and oriented to person, place, and time.  Psychiatric:        Mood and Affect: Mood normal.        Behavior: Behavior normal.        Thought Content: Thought content normal.        Judgment: Judgment normal.        Assessment & Plan:  1. Screening examination for STD (sexually transmitted disease) - Encouraged safe sex practices  - HIV Antibody (routine testing w rflx); Future - RPR; Future - RPR - HIV Antibody (routine testing w rflx) - Cervicovaginal ancillary only; Future - Cervicovaginal ancillary only   Shirline Frees, NP

## 2023-02-11 LAB — HIV ANTIBODY (ROUTINE TESTING W REFLEX): HIV 1&2 Ab, 4th Generation: NONREACTIVE

## 2023-02-11 LAB — RPR: RPR Ser Ql: NONREACTIVE

## 2023-02-17 LAB — CERVICOVAGINAL ANCILLARY ONLY
Bacterial Vaginitis (gardnerella): POSITIVE — AB
Candida Glabrata: NEGATIVE
Candida Vaginitis: NEGATIVE
Chlamydia: NEGATIVE
Comment: NEGATIVE
Comment: NEGATIVE
Comment: NEGATIVE
Comment: NEGATIVE
Comment: NEGATIVE
Comment: NORMAL
Neisseria Gonorrhea: NEGATIVE
Trichomonas: NEGATIVE

## 2023-02-22 ENCOUNTER — Other Ambulatory Visit: Payer: Self-pay | Admitting: Family Medicine

## 2023-02-22 ENCOUNTER — Encounter: Payer: Self-pay | Admitting: Family Medicine

## 2023-02-22 ENCOUNTER — Ambulatory Visit (INDEPENDENT_AMBULATORY_CARE_PROVIDER_SITE_OTHER): Payer: Medicaid Other | Admitting: Family Medicine

## 2023-02-22 VITALS — BP 94/60 | HR 70 | Temp 98.2°F | Ht 64.5 in | Wt 151.4 lb

## 2023-02-22 DIAGNOSIS — B009 Herpesviral infection, unspecified: Secondary | ICD-10-CM | POA: Diagnosis not present

## 2023-02-22 DIAGNOSIS — N760 Acute vaginitis: Secondary | ICD-10-CM

## 2023-02-22 MED ORDER — ACYCLOVIR 5 % EX OINT: 1.0000 | TOPICAL_OINTMENT | CUTANEOUS | 5 refills | Status: DC

## 2023-02-22 MED ORDER — ACYCLOVIR 200 MG/5ML PO SUSP
800.0000 mg | Freq: Two times a day (BID) | ORAL | Status: AC
Start: 2023-02-22 — End: 2023-02-27

## 2023-02-22 NOTE — Progress Notes (Signed)
   Acute Office Visit  Subjective:     Patient ID: Emily Lucas, female    DOB: 2000-10-09, 22 y.o.   MRN: [id number]  Chief Complaint  Patient presents with   Rash    Patient complains of a facial rash x1 week, states she was diagnosed with Herpes simplex 1&2 about 2 years ago by previous OB/GYN, given Rx for Acyclovir liquid    HPI Patient is in today for red outbreak on the upper lip and right cheek. States that she has a history of  oral herpes infection-- happens in the exact same place every time. States that she started her oral acyclovir, was taking 200 mg 5 times a day, however she feels like this outbreak has lasted longer and has been more severe than outbreaks she has had in the past. She reports that it is extremely painful and it is burning.  Review of Systems  Constitutional:  Negative for chills and fever.  All other systems reviewed and are negative.       Objective:    BP 94/60 (BP Location: Left Arm, Patient Position: Sitting, Cuff Size: Normal)   Pulse 70   Temp 98.2 F (36.8 C) (Oral)   Ht 5' 4.5" (1.638 m)   Wt 151 lb 6.4 oz (68.7 kg)   SpO2 99%   BMI 25.59 kg/m    Physical Exam Constitutional:      Appearance: Normal appearance. She is normal weight.  HENT:     Head:      Comments: There is a right sided rash in the area outlined above-- it is vesicular, blistering, with light yellow drainage, there is erythema and edema of the right cheek and upper lip Neurological:     Mental Status: She is alert.     No results found for any visits on 02/22/23.      Assessment & Plan:   Problem List Items Addressed This Visit   None Visit Diagnoses     Herpes simplex    -  Primary   Relevant Medications   ACYCLOVIR EX   acyclovir (ZOVIRAX) 200 MG/5ML suspension   acyclovir ointment (ZOVIRAX) 5 %     I advised that she might try increasing the dose of the acyclovir to 800 mg BID and I have sent in an rx for the acyclovir ointment. I also  recommended using antibacterial ointment on the area to help prevent a secondary bacterial infection.   Meds ordered this encounter  Medications   acyclovir (ZOVIRAX) 200 MG/5ML suspension    Sig: Take 20 mLs (800 mg total) by mouth 2 (two) times daily for 5 days.   acyclovir ointment (ZOVIRAX) 5 %    Sig: Apply 1 Application topically every 3 (three) hours.    Dispense:  30 g    Refill:  5    Return in about 4 months (around 06/24/2023) for annual physical with pap smear.  Karie Georges, MD

## 2023-03-02 ENCOUNTER — Encounter: Payer: Self-pay | Admitting: Adult Health

## 2023-03-02 ENCOUNTER — Ambulatory Visit (INDEPENDENT_AMBULATORY_CARE_PROVIDER_SITE_OTHER): Payer: Medicaid Other | Admitting: Adult Health

## 2023-03-02 VITALS — BP 102/64 | Temp 98.2°F | Ht 64.5 in | Wt 152.0 lb

## 2023-03-02 DIAGNOSIS — Z113 Encounter for screening for infections with a predominantly sexual mode of transmission: Secondary | ICD-10-CM

## 2023-03-02 DIAGNOSIS — B9689 Other specified bacterial agents as the cause of diseases classified elsewhere: Secondary | ICD-10-CM | POA: Diagnosis not present

## 2023-03-02 DIAGNOSIS — L239 Allergic contact dermatitis, unspecified cause: Secondary | ICD-10-CM | POA: Diagnosis not present

## 2023-03-02 DIAGNOSIS — N760 Acute vaginitis: Secondary | ICD-10-CM

## 2023-03-02 MED ORDER — METRONIDAZOLE 0.75 % EX GEL
CUTANEOUS | 0 refills | Status: DC
Start: 2023-03-02 — End: 2023-03-18

## 2023-03-02 MED ORDER — METHYLPREDNISOLONE ACETATE 80 MG/ML IJ SUSP
80.0000 mg | Freq: Once | INTRAMUSCULAR | Status: AC
Start: 2023-03-02 — End: 2023-03-02
  Administered 2023-03-02: 80 mg via INTRAMUSCULAR

## 2023-03-02 NOTE — Progress Notes (Unsigned)
Subjective:    Patient ID: Emily Lucas, female    DOB: May 04, 2001, 22 y.o.   MRN: [id number]  Vaginal Discharge The patient's primary symptoms include vaginal discharge. Associated symptoms include rash.  Rash   22 year old female who  has a past medical history of Gallstones, Herpes simplex type 1 infection, Herpes simplex type 2 infection, and Learning disability.  She is a patient of Dr. Casimiro Needle who I am seeing today for follow up. She was last seen by me less than a month ago for STD testing after treatment for BV and Chlamydia. She was treated with doxyc and metogel. On follow up testing she once again tested positive for BV. She was not treated and was advised to follow up with her PCP for a referral to GYN for recurrent BV. She was seen by her provider 8 days ago for red outbreak on her upper lip and right cheek. She has a history oral herpes in the exact same place in the past. She was taking her oral acyclovir 200 mg 5 x day but felt as this outbreak was lasting longer and had been more severe than outbreaks she has had in the past. Oral acyclovir was increased to 800 mg BID x 5 days   There was no mention of being referred to GYN during this visit.   Today she reports that she has had vaginal odor with a fishy smell with white clumpy discharge x 3 days. She has been sing Metrogel twice a week as directed by her PCP.  ]She also reports that she continues to have a red rash around her mouth, on her cheeks and rough skin around her eyes. She took her acyclovir as she was directed and then started using Neosporin on her face. Rash is not itching all the time. Not burning    Review of Systems  Genitourinary:  Positive for vaginal discharge.  Skin:  Positive for rash.   See HPI   Past Medical History:  Diagnosis Date   Gallstones    Herpes simplex type 1 infection    per patient   Herpes simplex type 2 infection    per patient   Learning disability    "comprehension"-  mother reported    Social History   Socioeconomic History   Marital status: Single    Spouse name: Not on file   Number of children: Not on file   Years of education: Not on file   Highest education level: 12th grade  Occupational History   Not on file  Tobacco Use   Smoking status: Never   Smokeless tobacco: Never  Vaping Use   Vaping status: Never Used  Substance and Sexual Activity   Alcohol use: Never   Drug use: Never   Sexual activity: Yes    Birth control/protection: Implant  Other Topics Concern   Not on file  Social History Narrative   Not on file   Social Determinants of Health   Financial Resource Strain: Medium Risk (07/20/2022)   Overall Financial Resource Strain (CARDIA)    Difficulty of Paying Living Expenses: Somewhat hard  Food Insecurity: Patient Declined (07/20/2022)   Hunger Vital Sign    Worried About Running Out of Food in the Last Year: Patient declined    Ran Out of Food in the Last Year: Patient declined  Transportation Needs: No Transportation Needs (07/20/2022)   PRAPARE - Administrator, Civil Service (Medical): No    Lack of Transportation (  Non-Medical): No  Physical Activity: Unknown (07/20/2022)   Exercise Vital Sign    Days of Exercise per Week: Patient declined    Minutes of Exercise per Session: Not on file  Stress: No Stress Concern Present (07/20/2022)   [employer] of Occupational Health - Occupational Stress Questionnaire    Feeling of Stress : Not at all  Social Connections: Unknown (07/20/2022)   Social Connection and Isolation Panel [NHANES]    Frequency of Communication with Friends and Family: Three times a week    Frequency of Social Gatherings with Friends and Family: Twice a week    Attends Religious Services: Patient declined    Active Member of Clubs or Organizations: No    Attends Banker Meetings: Not on file    Marital Status: Patient declined  Intimate Partner Violence: Not on file    Past  Surgical History:  Procedure Laterality Date   cholecystecomy  01/24/2017   CHOLECYSTECTOMY     ERCP N/A 02/01/2017   Procedure: ENDOSCOPIC RETROGRADE CHOLANGIOPANCREATOGRAPHY (ERCP);  Surgeon: Rachael Fee, MD;  Location: [facility] ENDOSCOPY;  Service: Endoscopy;  Laterality: N/A;    Family History  Problem Relation Age of Onset   Healthy Mother    Healthy Father     Allergies  Allergen Reactions   Motrin [Ibuprofen] Hives    Current Outpatient Medications on File Prior to Visit  Medication Sig Dispense Refill   ACYCLOVIR EX Apply topically. Liquid-as needed     acyclovir ointment (ZOVIRAX) 5 % Apply 1 Application topically every 3 (three) hours. 30 g 5   Etonogestrel (NEXPLANON [place]) Inject into the skin.     ketoconazole (NIZORAL) 2 % shampoo Apply 1 Application topically 2 (two) times a week. 120 mL 0   Selenium Sulfide 2.3 % SHAM Shampoo daily with selenium sulfide shampoo. 180 mL 1   triamcinolone cream (KENALOG) 0.1 % Apply 1 Application topically 2 (two) times daily. 453.6 g 5   [DISCONTINUED] iron polysaccharides (FERREX 150) 150 MG capsule Take 1 capsule (150 mg total) by mouth daily. 30 capsule 1   No current facility-administered medications on file prior to visit.    BP 102/64 (BP Location: Left Arm, Patient Position: Sitting, Cuff Size: Normal)   Temp 98.2 F (36.8 C) (Oral)   Ht 5' 4.5" (1.638 m)   Wt 152 lb (68.9 kg)   BMI 25.69 kg/m       Objective:   Physical Exam Vitals and nursing note reviewed.  Constitutional:      Appearance: Normal appearance.  Cardiovascular:     Rate and Rhythm: Normal rate and regular rhythm.     Pulses: Normal pulses.     Heart sounds: Normal heart sounds.  Pulmonary:     Effort: Pulmonary effort is normal.     Breath sounds: Normal breath sounds.  Skin:    General: Skin is warm and dry.     Capillary Refill: Capillary refill takes less than 2 seconds.     Findings: Erythema and rash present.     Comments: Flat red rash  noted around mouth and on bilateral cheeks. She has rough flaking skin on her upper eye lids  Neurological:     General: No focal deficit present.     Mental Status: She is alert and oriented to person, place, and time.  Psychiatric:        Mood and Affect: Mood normal.        Behavior: Behavior normal.  Thought Content: Thought content normal.        Judgment: Judgment normal.       Assessment & Plan:  1. Screening examination for STD (sexually transmitted disease) - Cervicovaginal ancillary only; Future  2. Bacterial vaginosis - Will refer to GYN for recurrent nature of symptoms  - Prescribed Metrogel for suspected symptomatic BV - needs to follow up with PCP as directed  - Ambulatory referral to Obstetrics / Gynecology - metroNIDAZOLE (METROGEL) 0.75 % gel; Insert one applicatorful (5g) of medicine into the vagina once nightly x 5 days  Dispense: 25 g; Refill: 0 - Cervicovaginal ancillary only; Future  3. Allergic dermatitis - Does not appear as herpes rash. Appears more as contact dermatitis  - methylPREDNISolone acetate (DEPO-MEDROL) injection 80 mg   Shirline Frees, NP  Time spent with patient today was 31 minutes which consisted of chart review, discussing allergic dermatitis and BV, work up, treatment answering questions and documentation.

## 2023-03-02 NOTE — Patient Instructions (Signed)
Health Maintenance Due  Topic Date Due   HPV VACCINES (1 - 3-dose series) Never done   DTaP/Tdap/Td (1 - Tdap) Never done   Cervical Cancer Screening (Pap smear)  Never done   INFLUENZA VACCINE  Never done   COVID-19 Vaccine (1 - 2023-24 season) Never done       02/22/2023   11:53 AM 01/04/2023    9:13 AM 07/30/2022   12:57 PM  Depression screen PHQ 2/9  Decreased Interest 0 0 0  Down, Depressed, Hopeless 0 0 0  PHQ - 2 Score 0 0 0  Altered sleeping 0 0 0  Tired, decreased energy 0 0 0  Change in appetite 0 0 0  Feeling bad or failure about yourself  0 0 0  Trouble concentrating 0 0 0  Moving slowly or fidgety/restless 0 0 0  Suicidal thoughts 0 0 0  PHQ-9 Score 0 0 0  Difficult doing work/chores   Not difficult at all

## 2023-03-03 ENCOUNTER — Other Ambulatory Visit (HOSPITAL_COMMUNITY)
Admission: RE | Admit: 2023-03-03 | Discharge: 2023-03-03 | Disposition: A | Discharge status: Home / Self Care | Payer: Medicaid Other | Source: Ambulatory Visit | Attending: *Deleted | Admitting: *Deleted

## 2023-03-03 DIAGNOSIS — B9689 Other specified bacterial agents as the cause of diseases classified elsewhere: Secondary | ICD-10-CM | POA: Diagnosis present

## 2023-03-03 DIAGNOSIS — Z113 Encounter for screening for infections with a predominantly sexual mode of transmission: Secondary | ICD-10-CM | POA: Insufficient documentation

## 2023-03-04 LAB — CERVICOVAGINAL ANCILLARY ONLY
Bacterial Vaginitis (gardnerella): POSITIVE — AB
Candida Glabrata: NEGATIVE
Candida Vaginitis: NEGATIVE
Chlamydia: NEGATIVE
Comment: NEGATIVE
Comment: NEGATIVE
Comment: NEGATIVE
Comment: NEGATIVE
Comment: NEGATIVE
Comment: NORMAL
Neisseria Gonorrhea: NEGATIVE
Trichomonas: NEGATIVE

## 2023-03-09 ENCOUNTER — Encounter (HOSPITAL_COMMUNITY): Payer: Self-pay | Admitting: Emergency Medicine

## 2023-03-09 ENCOUNTER — Telehealth: Payer: Self-pay | Admitting: Family Medicine

## 2023-03-09 ENCOUNTER — Ambulatory Visit (HOSPITAL_COMMUNITY)
Admission: EM | Admit: 2023-03-09 | Discharge: 2023-03-09 | Disposition: A | Discharge status: Home / Self Care | Payer: Medicaid Other | Attending: Physician Assistant | Admitting: Physician Assistant

## 2023-03-09 DIAGNOSIS — L239 Allergic contact dermatitis, unspecified cause: Secondary | ICD-10-CM

## 2023-03-09 MED ORDER — DESONIDE 0.05 % EX CREA: TOPICAL_CREAM | Freq: Two times a day (BID) | CUTANEOUS | 0 refills | Status: DC

## 2023-03-09 NOTE — ED Provider Notes (Signed)
[facility]    CSN: [id number] Arrival date & time: 03/09/23  1557      History   Chief Complaint Chief Complaint  Patient presents with   Allergic Reaction    face    HPI Iya A Sayson is a 22 y.o. female.    Allergic Reaction Presenting symptoms: rash   Presenting symptoms: no difficulty swallowing and no wheezing     Rash on face x 1 week, very itchy, oozing, associated with mild swelling. Seen for same several days ago got DepoMedrol injection,states no change. Denies plant exposure, close contacts with similar rash, change in hygiene products, fever, chills, eye redness pain or drainage, sore throat, difficulty breathing, coughing, wheezing  Past Medical History:  Diagnosis Date   Gallstones    Herpes simplex type 1 infection    per patient   Herpes simplex type 2 infection    per patient   Learning disability    "comprehension"- mother reported    Patient Active Problem List   Diagnosis Date Noted   Acute blood loss anemia 01/26/2020   Postpartum care following vaginal delivery 8/13 01/25/2020   Maternal anemia, with delivery - IDA with superimposed ABL 01/25/2020   Normal labor 01/24/2020   Supervision of normal first teen pregnancy 01/24/2020   Chorioamnionitis in third trimester, fetus 1 01/24/2020   Abnormal cholangiogram    S/P laparoscopic cholecystectomy 01/25/2017    Past Surgical History:  Procedure Laterality Date   cholecystecomy  01/24/2017   CHOLECYSTECTOMY     ERCP N/A 02/01/2017   Procedure: ENDOSCOPIC RETROGRADE CHOLANGIOPANCREATOGRAPHY (ERCP);  Surgeon: Rachael Fee, MD;  Location: [facility] ENDOSCOPY;  Service: Endoscopy;  Laterality: N/A;    OB History     Gravida  1   Para  1   Term  1   Preterm  0   AB  0   Living  1      SAB  0   IAB  0   Ectopic  0   Multiple  0   Live Births  1            Home Medications    Prior to Admission medications   Medication Sig Start Date End Date Taking?  Authorizing Provider  ACYCLOVIR EX Apply topically. Liquid-as needed    [provider]  acyclovir ointment (ZOVIRAX) 5 % Apply 1 Application topically every 3 (three) hours. 02/22/23   Karie Georges, MD  Etonogestrel [facility] 2) Inject into the skin.    [provider]  ketoconazole (NIZORAL) 2 % shampoo Apply 1 Application topically 2 (two) times a week. 03/25/22   Merrilee Jansky, MD  metroNIDAZOLE (METROGEL) 0.75 % gel Insert one applicatorful (5g) of medicine into the vagina once nightly x 5 days 03/02/23   Shirline Frees, NP  Selenium Sulfide 2.3 % SHAM Shampoo daily with selenium sulfide shampoo. 03/22/22   LampteyBritta Mccreedy, MD  triamcinolone cream (KENALOG) 0.1 % Apply 1 Application topically 2 (two) times daily. 10/04/22   Karie Georges, MD  iron polysaccharides (FERREX 150) 150 MG capsule Take 1 capsule (150 mg total) by mouth daily. 01/27/20 04/30/20  June Leap, CNM    Family History Family History  Problem Relation Age of Onset   Healthy Mother    Healthy Father     Social History Social History   Tobacco Use   Smoking status: Never   Smokeless tobacco: Never  Vaping Use   Vaping status: Never Used  Substance Use Topics   Alcohol use: Never   Drug use: Never     Allergies   Motrin [ibuprofen]   Review of Systems Review of Systems  Constitutional:  Negative for chills and fever.  HENT:  Negative for ear discharge, ear pain, rhinorrhea, sore throat, trouble swallowing and voice change.   Respiratory:  Negative for cough, shortness of breath and wheezing.   Skin:  Positive for rash.     Physical Exam Triage Vital Signs ED Triage Vitals  Encounter Vitals Group     BP 03/09/23 1710 111/72     Systolic BP Percentile --      Diastolic BP Percentile --      Pulse Rate 03/09/23 1710 77     Resp 03/09/23 1710 18     Temp 03/09/23 1710 98.5 F (36.9 C)     Temp Source 03/09/23 1710 Oral     SpO2 03/09/23 1710 97 %      Weight --      Height --      Head Circumference --      Peak Flow --      Pain Score 03/09/23 1709 0     Pain Loc --      Pain Education --      Exclude from Growth Chart --    No data found.  Updated Vital Signs BP 111/72 (BP Location: Left Arm)   Pulse 77   Temp 98.5 F (36.9 C) (Oral)   Resp 18   SpO2 97%   Visual Acuity Right Eye Distance:   Left Eye Distance:   Bilateral Distance:    Right Eye Near:   Left Eye Near:    Bilateral Near:     Physical Exam Vitals and nursing note reviewed.  Constitutional:      Appearance: She is not ill-appearing.  HENT:     Head: Normocephalic and atraumatic.     Comments: Confluent papular rash on face (perioral, periorbital, nose, cheeks), some crusting, no discrete vesicles, no pustules    Right Ear: Tympanic membrane and ear canal normal.     Left Ear: Tympanic membrane and ear canal normal.     Nose: No rhinorrhea.     Mouth/Throat:     Mouth: Mucous membranes are moist.     Pharynx: Oropharynx is clear. No posterior oropharyngeal erythema.  Eyes:     Conjunctiva/sclera: Conjunctivae normal.  Skin:    Findings: Rash present.  Neurological:     Mental Status: She is alert.      UC Treatments / Results  Labs (all labs ordered are listed, but only abnormal results are displayed) Labs Reviewed - No data to display  EKG   Radiology No results found.  Procedures Procedures (including critical care time)  Medications Ordered in UC Medications - No data to display  Initial Impression / Assessment and Plan / UC Course  I have reviewed the triage vital signs and the nursing notes.  Pertinent labs & imaging results that were available during my care of the patient were reviewed by me and considered in my medical decision making (see chart for details).     Rash consistent with contact dermatitis, has already received IM steroid injection, will Rx topical. Pt counseled to avoid near eyes and mouth.  Can try OTC  Zanfel and antihistamine  Final Clinical Impressions(s) / UC Diagnoses   Final diagnoses:  None   Discharge Instructions   None    ED Prescriptions  None    PDMP not reviewed this encounter.   Meliton Rattan, [place] 03/09/23 1732

## 2023-03-09 NOTE — Discharge Instructions (Addendum)
Zanfel, over the counter as directed on the package. Over the counter antihistamine (ie Claritin, allegra, zyrtec or benadryl) as directed on the package

## 2023-03-09 NOTE — Telephone Encounter (Signed)
Patient is requesting a call back per myChart about notes for his visit.

## 2023-03-09 NOTE — ED Triage Notes (Signed)
Pt first had rash on face week ago. Went to see provider 3 days ago and received a steroid shot. Pt states the shot didn't not really help and the rash has gotten worse and is leaking yellow fluid.

## 2023-03-10 NOTE — Telephone Encounter (Signed)
Unable to leave a message due to voicemail not being set up.

## 2023-03-18 ENCOUNTER — Other Ambulatory Visit (HOSPITAL_COMMUNITY)
Admission: RE | Admit: 2023-03-18 | Discharge: 2023-03-18 | Disposition: A | Discharge status: Home / Self Care | Payer: Medicaid Other | Source: Ambulatory Visit | Attending: *Deleted | Admitting: *Deleted

## 2023-03-18 ENCOUNTER — Ambulatory Visit (INDEPENDENT_AMBULATORY_CARE_PROVIDER_SITE_OTHER): Payer: Medicaid Other | Admitting: Family Medicine

## 2023-03-18 ENCOUNTER — Encounter: Payer: Self-pay | Admitting: Family Medicine

## 2023-03-18 VITALS — BP 88/58 | HR 90 | Temp 98.3°F | Ht 64.5 in | Wt 148.3 lb

## 2023-03-18 DIAGNOSIS — B9689 Other specified bacterial agents as the cause of diseases classified elsewhere: Secondary | ICD-10-CM | POA: Insufficient documentation

## 2023-03-18 DIAGNOSIS — N760 Acute vaginitis: Secondary | ICD-10-CM | POA: Diagnosis present

## 2023-03-18 DIAGNOSIS — B3731 Acute candidiasis of vulva and vagina: Secondary | ICD-10-CM | POA: Diagnosis not present

## 2023-03-18 MED ORDER — METRONIDAZOLE 0.75 % EX GEL
CUTANEOUS | 5 refills | Status: DC
Start: 2023-03-18 — End: 2023-10-18

## 2023-03-18 NOTE — Progress Notes (Unsigned)
Established Patient Office Visit  Subjective   Patient ID: Emily Lucas, female    DOB: Oct 12, 2000  Age: 22 y.o. MRN: [id number]  Chief Complaint  Patient presents with  . patient requests STD testing-denies exposure  . Medication Refill    Patient requests refills on Metrogel    Pt reports her rash on her face has resolved. States she used the antivirals that were prescribed but the rash did not improve. Went to urgent care and was told it was an allergic reaction, they gave her steroids and her rash has resolved.   Medication Refill   Current Outpatient Medications  Medication Instructions  . ACYCLOVIR EX Apply externally, Liquid-as needed  . acyclovir ointment (ZOVIRAX) 5 % 1 Application, Topical, Every  3 hours  . desonide (DESOWEN) 0.05 % cream Topical, 2 times daily, Avoid eyes and mouth  . Etonogestrel (NEXPLANON [place]) Subcutaneous  . ketoconazole (NIZORAL) 2 % shampoo 1 Application, Topical, 2 times weekly  . metroNIDAZOLE (METROGEL) 0.75 % gel Insert 1 applicator full  as needed after intercourse, otherwise apply applicator twice a week.  . Selenium Sulfide 2.3 % SHAM Shampoo daily with selenium sulfide shampoo.  . triamcinolone cream (KENALOG) 0.1 % 1 Application, Topical, 2 times daily    Patient Active Problem List   Diagnosis Date Noted  . Acute blood loss anemia 01/26/2020  . Postpartum care following vaginal delivery 8/13 01/25/2020  . Maternal anemia, with delivery - IDA with superimposed ABL 01/25/2020  . Normal labor 01/24/2020  . Supervision of normal first teen pregnancy 01/24/2020  . Chorioamnionitis in third trimester, fetus 1 01/24/2020  . Abnormal cholangiogram   . S/P laparoscopic cholecystectomy 01/25/2017      Review of Systems  All other systems reviewed and are negative.     Objective:     BP (!) 88/58 (BP Location: Left Arm, Patient Position: Sitting, Cuff Size: Normal)   Pulse 90   Temp 98.3 F (36.8 C) (Oral)   Ht 5' 4.5" (1.638  m)   Wt 148 lb 4.8 oz (67.3 kg)   SpO2 99%   BMI 25.06 kg/m  {Vitals History (Optional):23777}  Physical Exam Constitutional:      Appearance: Normal appearance. She is normal weight.  Neurological:     Mental Status: She is alert.     No results found for any visits on 03/18/23.  {Labs (Optional):23779}  The ASCVD Risk score (Arnett DK, et al., 2019) failed to calculate for the following reasons:   The 2019 ASCVD risk score is only valid for ages 63 to 86    Assessment & Plan:  Bacterial vaginosis -     metroNIDAZOLE; Insert 1 applicator full  as needed after intercourse, otherwise apply applicator twice a week.  Dispense: 45 g; Refill: 5 -     Cervicovaginal ancillary only     No follow-ups on file.    Karie Georges, MD

## 2023-03-21 ENCOUNTER — Telehealth: Payer: Self-pay | Admitting: Family Medicine

## 2023-03-21 NOTE — Telephone Encounter (Signed)
Pt was seen on 03-18-2023 and need early refill on metroNIDAZOLE (METROGEL) 0.75 % gel  [id number] - [place], [place] - [address] Phone: [phone]  Fax: [phone]

## 2023-03-21 NOTE — Telephone Encounter (Signed)
I called the pharmacy as the Rx was sent on 10/4.  Per Misty Stanley the Rx was received and was not filled initially as it was too soon to refill since she just picked this up on 9/10.  Misty Stanley stated Medicaid only pays for one Rx per month and they will contact her when this is ready for pick up.  Spoke with the patient and informed her of this also.

## 2023-03-22 ENCOUNTER — Other Ambulatory Visit: Payer: Self-pay | Admitting: Family Medicine

## 2023-03-22 DIAGNOSIS — B9689 Other specified bacterial agents as the cause of diseases classified elsewhere: Secondary | ICD-10-CM

## 2023-03-22 LAB — CERVICOVAGINAL ANCILLARY ONLY
Bacterial Vaginitis (gardnerella): NEGATIVE
Candida Glabrata: NEGATIVE
Candida Vaginitis: POSITIVE — AB
Comment: NEGATIVE
Comment: NEGATIVE
Comment: NEGATIVE

## 2023-03-22 NOTE — Telephone Encounter (Signed)
Pt called to say she reviewed her test results online, and would like to request a cream.  Also, Pt states she did not receive all of her lab results, and would like to request a call back to discuss.  [id number] - [place], [place] - [address] Phone: [phone]  Fax: [phone]

## 2023-03-23 DIAGNOSIS — N760 Acute vaginitis: Secondary | ICD-10-CM | POA: Insufficient documentation

## 2023-03-23 MED ORDER — FLUCONAZOLE 150 MG PO TABS
150.0000 mg | ORAL_TABLET | Freq: Once | ORAL | 0 refills | Status: AC
Start: 2023-03-23 — End: 2023-03-23

## 2023-03-23 NOTE — Telephone Encounter (Signed)
Pt states she does not see her lab results on MyChart. She would like results to be released in MyChart.

## 2023-03-23 NOTE — Telephone Encounter (Signed)
Pt states she already knows about the BV results. Pt needs a copy of All her STI results. Pt cannot see her STI results on MYChart.  Please review ,and call Pt back to discuss.

## 2023-03-23 NOTE — Telephone Encounter (Signed)
Results released via Mychart message.

## 2023-03-23 NOTE — Telephone Encounter (Signed)
Pt called to say the pharmacy is giving her a hard time with this Rx.  Pt states she needs the applicator that you insert for 5 days. Pharmacy is claiming MD only sent a topical cream. Pt is asking that someone call the pharmacy and clear this up for her, because she would like to start Rx, as soon as possible. Pt wants the 5 day applicator.  [id number] - [place], [place] - [address] Phone: [phone]  Fax: [phone]      Also, Pt says she cannot see her results on her Mychart, showing negative results for her STIs. Pt would like to know how she can get a copy reflecting all her negative results?  Please call Pt back at your earliest convenience, to discuss.

## 2023-03-23 NOTE — Telephone Encounter (Signed)
Answered in previous task, ok to close

## 2023-03-23 NOTE — Addendum Note (Signed)
Addended by: Karie Georges on: 03/23/2023 09:00 AM   Modules accepted: Orders

## 2023-03-23 NOTE — Progress Notes (Signed)
Sent 1 tablet of diflucan

## 2023-03-23 NOTE — Assessment & Plan Note (Signed)
Recurrent infections -- pt is doing well with twice weekly/ after coitus treatments with the metronidazole gel. Will continue this for prophylaxis. Pt is requesting new vaginal swab testing today.

## 2023-03-23 NOTE — Telephone Encounter (Signed)
Noted  

## 2023-03-25 ENCOUNTER — Other Ambulatory Visit: Payer: Self-pay | Admitting: Family Medicine

## 2023-03-25 ENCOUNTER — Other Ambulatory Visit: Payer: Medicaid Other

## 2023-03-25 DIAGNOSIS — Z114 Encounter for screening for human immunodeficiency virus [HIV]: Secondary | ICD-10-CM

## 2023-03-25 DIAGNOSIS — Z209 Contact with and (suspected) exposure to unspecified communicable disease: Secondary | ICD-10-CM

## 2023-03-25 DIAGNOSIS — Z113 Encounter for screening for infections with a predominantly sexual mode of transmission: Secondary | ICD-10-CM

## 2023-03-28 ENCOUNTER — Other Ambulatory Visit: Payer: Self-pay | Admitting: Family Medicine

## 2023-03-28 ENCOUNTER — Ambulatory Visit: Payer: Medicaid Other | Admitting: Family Medicine

## 2023-03-29 ENCOUNTER — Telehealth: Payer: Self-pay | Admitting: *Deleted

## 2023-03-29 ENCOUNTER — Other Ambulatory Visit (HOSPITAL_COMMUNITY)
Admission: RE | Admit: 2023-03-29 | Discharge: 2023-03-29 | Disposition: A | Discharge status: Home / Self Care | Payer: Medicaid Other | Source: Ambulatory Visit | Attending: Family Medicine | Admitting: Family Medicine

## 2023-03-29 DIAGNOSIS — Z113 Encounter for screening for infections with a predominantly sexual mode of transmission: Secondary | ICD-10-CM | POA: Diagnosis present

## 2023-03-29 NOTE — Telephone Encounter (Signed)
Emily Lucas called from [facility] stating the cervicovaginal ancillary test order is not crossing over to their system as this was entered as future.  Order was re-entered with today and she stated this is now noted in the system.

## 2023-03-31 LAB — CERVICOVAGINAL ANCILLARY ONLY
Bacterial Vaginitis (gardnerella): NEGATIVE
Candida Glabrata: NEGATIVE
Candida Vaginitis: NEGATIVE
Chlamydia: NEGATIVE
Comment: NEGATIVE
Comment: NEGATIVE
Comment: NEGATIVE
Comment: NEGATIVE
Comment: NEGATIVE
Comment: NORMAL
Neisseria Gonorrhea: NEGATIVE
Trichomonas: NEGATIVE

## 2023-07-04 ENCOUNTER — Ambulatory Visit: Payer: Medicaid Other | Admitting: Family Medicine

## 2023-09-13 ENCOUNTER — Ambulatory Visit: Admitting: Family Medicine

## 2023-10-18 ENCOUNTER — Encounter: Payer: Self-pay | Admitting: Family Medicine

## 2023-10-18 ENCOUNTER — Ambulatory Visit: Admitting: Family Medicine

## 2023-10-18 ENCOUNTER — Other Ambulatory Visit (HOSPITAL_COMMUNITY)
Admission: RE | Admit: 2023-10-18 | Discharge: 2023-10-18 | Disposition: A | Discharge status: Home / Self Care | Source: Ambulatory Visit | Attending: Family Medicine | Admitting: Family Medicine

## 2023-10-18 VITALS — BP 100/60 | HR 80 | Temp 99.4°F | Ht 64.5 in | Wt 141.9 lb

## 2023-10-18 DIAGNOSIS — L219 Seborrheic dermatitis, unspecified: Secondary | ICD-10-CM

## 2023-10-18 DIAGNOSIS — Z209 Contact with and (suspected) exposure to unspecified communicable disease: Secondary | ICD-10-CM

## 2023-10-18 DIAGNOSIS — N760 Acute vaginitis: Secondary | ICD-10-CM

## 2023-10-18 DIAGNOSIS — Z113 Encounter for screening for infections with a predominantly sexual mode of transmission: Secondary | ICD-10-CM | POA: Insufficient documentation

## 2023-10-18 DIAGNOSIS — L2082 Flexural eczema: Secondary | ICD-10-CM | POA: Diagnosis not present

## 2023-10-18 DIAGNOSIS — B9689 Other specified bacterial agents as the cause of diseases classified elsewhere: Secondary | ICD-10-CM

## 2023-10-18 DIAGNOSIS — Z114 Encounter for screening for human immunodeficiency virus [HIV]: Secondary | ICD-10-CM

## 2023-10-18 MED ORDER — METRONIDAZOLE 0.75 % EX GEL
CUTANEOUS | 5 refills | Status: DC
Start: 2023-10-18 — End: 2023-12-07

## 2023-10-18 MED ORDER — TRIAMCINOLONE ACETONIDE 0.1 % EX CREA
1.0000 | TOPICAL_CREAM | Freq: Two times a day (BID) | CUTANEOUS | 5 refills | Status: DC
Start: 2023-10-18 — End: 2023-12-07

## 2023-10-18 MED ORDER — SELENIUM SULFIDE 2.3 % EX SHAM: MEDICATED_SHAMPOO | CUTANEOUS | 5 refills | Status: DC

## 2023-10-18 NOTE — Progress Notes (Signed)
 Established Patient Office Visit  Subjective   Patient ID: Emily Lucas, female    DOB: [birth year]  Age: 23 y.o. MRN: [id number]  Chief Complaint  Patient presents with   Requests STD testing, denies exposure   Medication Refill    Patient requests refills on Triamcinolone  cream and Metrogel     Pt reports she is here for STI testing. States that she had unprotected intercourse recently. Reports she continues to take the metronidazole  gel twice a week for prevention. Denies any fever or chills, no pelvic pain and no unusual vaginal discharge.   Pt is also reporting scalp dandruff, itchiness and flaking. States she was given selenium  sulfide shampoo in the past which helped but it has returned.     Current Outpatient Medications  Medication Instructions   ACYCLOVIR  EX Apply topically. Liquid-as needed   acyclovir  ointment (ZOVIRAX ) 5 % 1 Application, Topical, Every  3 hours   desonide  (DESOWEN ) 0.05 % cream Topical, 2 times daily, Avoid eyes and mouth   Etonogestrel  (NEXPLANON  [place]) Inject into the skin.   ketoconazole  (NIZORAL ) 2 % shampoo 1 Application, Topical, 2 times weekly   metroNIDAZOLE  (METROGEL ) 0.75 % gel Insert 1 applicator full  as needed after intercourse, otherwise apply applicator twice a week.   metroNIDAZOLE  (METROGEL ) 0.75 % vaginal gel APPLY 1 APPLICATOR FULL VAGINALLY AFTER INTERCOURSE AS NEEDED OR TWICE WEEKLY   metroNIDAZOLE  (METROGEL ) 0.75 % vaginal gel 1 Applicatorful, Vaginal, Daily at bedtime   Selenium  Sulfide 2.3 % SHAM Shampoo daily with selenium  sulfide shampoo for 7 days, then shampoo every other day for 7 days, then twice a week for 7 days, then once weekly   triamcinolone  cream (KENALOG ) 0.1 % 1 Application, Topical, 2 times daily      Review of Systems  All other systems reviewed and are negative.     Objective:     BP 100/60   Pulse 80   Temp 99.4 F (37.4 C) (Oral)   Ht 5' 4.5" (1.638 m)   Wt 141 lb 14.4 oz (64.4 kg)   SpO2 96%    BMI 23.98 kg/m    Physical Exam Constitutional:      Appearance: Normal appearance. She is normal weight.  Eyes:     Conjunctiva/sclera: Conjunctivae normal.  Pulmonary:     Effort: Pulmonary effort is normal.  Skin:    General: Skin is warm and dry.  Neurological:     Mental Status: She is alert and oriented to person, place, and time. Mental status is at baseline.  Psychiatric:        Mood and Affect: Mood normal.        Behavior: Behavior normal.      The ASCVD Risk score (Arnett DK, et al., 2019) failed to calculate for the following reasons:   The 2019 ASCVD risk score is only valid for ages 61 to 34    Assessment & Plan:  Seborrheic dermatitis of scalp -     Selenium  Sulfide; Shampoo daily with selenium  sulfide shampoo for 7 days, then shampoo every other day for 7 days, then twice a week for 7 days, then once weekly  Dispense: 180 mL; Refill: 5  Bacterial vaginosis -     metroNIDAZOLE ; Insert 1 applicator full  as needed after intercourse, otherwise apply applicator twice a week.  Dispense: 45 g; Refill: 5 -     metroNIDAZOLE ; Place 1 Applicatorful vaginally at bedtime for 5 days.  Dispense: 70 g; Refill: 0  Flexural eczema -  Triamcinolone  Acetonide; Apply 1 Application topically 2 (two) times daily.  Dispense: 453.6 g; Refill: 5  Exposure to communicable disease -     Cervicovaginal ancillary only -     Hepatitis C antibody; Future -     RPR  Screening examination for STD (sexually transmitted disease) -     Cervicovaginal ancillary only -     HIV Antibody (routine testing w rflx) -     RPR  Encounter for screening for HIV -     HIV Antibody (routine testing w rflx)   Screenings for STI ordered. Will also rx selenium  sulfide shampoo and refill the triamcinolone  cream  Return in about 6 months (around 04/19/2024) for annual physical exam.    Aida House, MD

## 2023-10-19 ENCOUNTER — Telehealth: Payer: Self-pay

## 2023-10-19 ENCOUNTER — Other Ambulatory Visit: Payer: Self-pay | Admitting: Family Medicine

## 2023-10-19 DIAGNOSIS — L219 Seborrheic dermatitis, unspecified: Secondary | ICD-10-CM

## 2023-10-19 DIAGNOSIS — B9689 Other specified bacterial agents as the cause of diseases classified elsewhere: Secondary | ICD-10-CM

## 2023-10-19 LAB — CERVICOVAGINAL ANCILLARY ONLY
Bacterial Vaginitis (gardnerella): POSITIVE — AB
Candida Glabrata: NEGATIVE
Candida Vaginitis: NEGATIVE
Chlamydia: NEGATIVE
Comment: NEGATIVE
Comment: NEGATIVE
Comment: NEGATIVE
Comment: NEGATIVE
Comment: NEGATIVE
Comment: NORMAL
Neisseria Gonorrhea: NEGATIVE
Trichomonas: NEGATIVE

## 2023-10-19 NOTE — Telephone Encounter (Signed)
 Duplicate message-see prior phone note under Rx request.

## 2023-10-19 NOTE — Telephone Encounter (Signed)
 Copied from CRM [id number]. Topic: General - Call Back - No Documentation >> Oct 19, 2023  2:30 PM Marlan Silva wrote: Reason for CRM: Patient states that she had a missed call from Dr. Sheria Dills nurse and is requesting a call back. There was no documentation in the chart.

## 2023-10-19 NOTE — Telephone Encounter (Signed)
 Copied from CRM [id number]. Topic: Clinical - Prescription Issue >> Oct 19, 2023 11:48 AM Dimple Francis wrote: Reason for CRM: Shampoo prescription that was called in, is not covered by insurance. Wanting to get another one called in. >> Oct 19, 2023  3:55 PM Chuck Crater wrote: Patient stated that she received 3 missed calls from the nurse and is asking that someone call her back today. She stated that the wrong vaginal gel was called in as well as the shampoo

## 2023-10-19 NOTE — Telephone Encounter (Signed)
 Copied from CRM [id number]. Topic: Clinical - Prescription Issue >> Oct 19, 2023 11:48 AM Dimple Francis wrote: Reason for CRM: Shampoo prescription that was called in, is not covered by insurance. Wanting to get another one called in.

## 2023-10-19 NOTE — Telephone Encounter (Signed)
 Patient informed of the message below and Rx denial was sent to the pharmacy.

## 2023-10-19 NOTE — Telephone Encounter (Signed)
 See prior phone note under Rx request.

## 2023-10-19 NOTE — Telephone Encounter (Signed)
 She can get the over the counter selenium  sulfide shampoo-- have her wash daily for 14 days instead of 7 days and then she can slowly taper down to twice a week after that.

## 2023-10-19 NOTE — Telephone Encounter (Signed)
 Attempted to contact the patient again and was unable to leave a message at the patient's cell number due to voicemail not being set up yet.

## 2023-10-19 NOTE — Telephone Encounter (Signed)
 Unable to leave a message at the patient's cell number due to voicemail not being set up yet.

## 2023-10-20 LAB — HEPATITIS C ANTIBODY: Hepatitis C Ab: NONREACTIVE

## 2023-10-20 LAB — RPR: RPR Ser Ql: NONREACTIVE

## 2023-10-20 LAB — HIV ANTIBODY (ROUTINE TESTING W REFLEX): HIV 1&2 Ab, 4th Generation: NONREACTIVE

## 2023-10-20 MED ORDER — METRONIDAZOLE 0.75 % VA GEL: 1.0000 | Freq: Every day | VAGINAL | 0 refills | Status: AC

## 2023-10-21 NOTE — Progress Notes (Signed)
 Ok to close

## 2023-12-07 ENCOUNTER — Ambulatory Visit (HOSPITAL_COMMUNITY)
Admission: EM | Admit: 2023-12-07 | Discharge: 2023-12-07 | Disposition: A | Discharge status: Home / Self Care | Attending: Family Medicine | Admitting: Family Medicine

## 2023-12-07 ENCOUNTER — Encounter (HOSPITAL_COMMUNITY): Payer: Self-pay

## 2023-12-07 DIAGNOSIS — T7840XA Allergy, unspecified, initial encounter: Secondary | ICD-10-CM

## 2023-12-07 MED ORDER — CEFDINIR 250 MG/5ML PO SUSR: 300.0000 mg | Freq: Two times a day (BID) | ORAL | 0 refills | Status: AC

## 2023-12-07 MED ORDER — DEXAMETHASONE SODIUM PHOSPHATE 10 MG/ML IJ SOLN
10.0000 mg | Freq: Once | INTRAMUSCULAR | Status: AC
  Administered 2023-12-07: 10 mg via INTRAMUSCULAR

## 2023-12-07 MED ORDER — DEXAMETHASONE SODIUM PHOSPHATE 10 MG/ML IJ SOLN
INTRAMUSCULAR | Status: AC
  Filled 2023-12-07: qty 1

## 2023-12-07 NOTE — ED Triage Notes (Signed)
 Patient reports that she has a rsh that is weepy in places around her hairline, posterior neck, behind her ears, chest, and face x 2 days.  Patient states she has been using an OTC anti-itch cream that is not helping and Benadryl  is not helping.

## 2023-12-07 NOTE — Discharge Instructions (Signed)
 Meds ordered this encounter  Medications   cefdinir (OMNICEF) 250 MG/5ML suspension    Sig: Take 6 mLs (300 mg total) by mouth 2 (two) times daily for 10 days.    Dispense:  120 mL    Refill:  0   dexamethasone  (DECADRON ) injection 10 mg

## 2023-12-10 NOTE — ED Provider Notes (Signed)
 [facility]   [id number] 12/07/23 Arrival Time: 1248  ASSESSMENT & PLAN:  1. Allergic reaction, initial encounter    Likely overlying staph infection. Meds ordered this encounter  Medications   cefdinir  (OMNICEF ) 250 MG/5ML suspension    Sig: Take 6 mLs (300 mg total) by mouth 2 (two) times daily for 10 days.    Dispense:  120 mL    Refill:  0   dexamethasone  (DECADRON ) injection 10 mg    Follow-up Information     [name], MD.   Specialty: Family Medicine Why: If worsening or failing to improve as anticipated. Contact information: [address] [place] [address] [phone]                 Reviewed expectations re: course of current medical issues. Questions answered. Outlined signs and symptoms indicating need for more acute intervention. Patient verbalized understanding. After Visit Summary given.   SUBJECTIVE: History from: patient. Emily Lucas is a 24 y.o. female who presents for evaluation of a possible allergic reaction.  Reports weep rash around face/hairline/neck; x sev days. Very itchy. Unknown trigger. Benadryl  without help. Afebrile. No h/o similar.    OBJECTIVE:  Vitals:   12/07/23 1314  BP: 97/67  Pulse: 80  Resp: 14  Temp: 98.7 F (37.1 C)  TempSrc: Oral  SpO2: 98%    General appearance: alert; no distress Eyes: PERRLA; EOMI; conjunctiva normal HENT: normocephalic; atraumatic; TMs normal; nasal mucosa normal; oral mucosa normal Neck: supple  Back: no CVA tenderness Extremities: no cyanosis or edema; symmetrical with no gross deformities Skin: warm and dry; patchy erythematous rash over forehead and hairline; very bad on posterior neck; with overlying yellowish crusting Psychological: alert and cooperative; normal mood and affect    Allergies  Allergen Reactions   Motrin  [Ibuprofen ] Hives    Past Medical History:  Diagnosis Date   Gallstones    Herpes simplex type 1 infection     per patient   Herpes simplex type 2 infection    per patient   Learning disability    comprehension- mother reported   Social History   Socioeconomic History   Marital status: Single    Spouse name: Not on file   Number of children: Not on file   Years of education: Not on file   Highest education level: 12th grade  Occupational History   Not on file  Tobacco Use   Smoking status: Never   Smokeless tobacco: Never  Vaping Use   Vaping status: Never Used  Substance and Sexual Activity   Alcohol use: Never   Drug use: Never   Sexual activity: Yes    Birth control/protection: Implant  Other Topics Concern   Not on file  Social History Narrative   Not on file   Social Drivers of Health   Financial Resource Strain: Patient Declined (10/17/2023)   Overall Financial Resource Strain (CARDIA)    Difficulty of Paying Living Expenses: Patient declined  Food Insecurity: Patient Declined (10/17/2023)   Hunger Vital Sign    Worried About Running Out of Food in the Last Year: Patient declined    Ran Out of Food in the Last Year: Patient declined  Transportation Needs: Patient Declined (10/17/2023)   PRAPARE - Administrator, Civil Service (Medical): Patient declined    Lack of Transportation (Non-Medical): Patient declined  Physical Activity: Unknown (10/17/2023)   Exercise Vital Sign    Days of Exercise per Week: 0 days  Minutes of Exercise per Session: Not on file  Stress: No Stress Concern Present (10/17/2023)   [employer] of Occupational Health - Occupational Stress Questionnaire    Feeling of Stress : Not at all  Social Connections: Unknown (10/17/2023)   Social Connection and Isolation Panel    Frequency of Communication with Friends and Family: Twice a week    Frequency of Social Gatherings with Friends and Family: Twice a week    Attends Religious Services: Patient declined    Database administrator or Organizations: Yes    Attends Banker  Meetings: Never    Marital Status: Patient declined  Catering manager Violence: Not on file   Family History  Problem Relation Age of Onset   Healthy Mother    Healthy Father    Past Surgical History:  Procedure Laterality Date   cholecystecomy  01/24/2017   CHOLECYSTECTOMY     ERCP N/A 02/01/2017   Procedure: ENDOSCOPIC RETROGRADE CHOLANGIOPANCREATOGRAPHY (ERCP);  Surgeon: Teressa Toribio SQUIBB, MD;  Location: [facility] ENDOSCOPY;  Service: Endoscopy;  Laterality: N/A;     Rolinda Rogue, MD 12/10/23 1016

## 2024-01-02 ENCOUNTER — Ambulatory Visit: Admission: EM | Admit: 2024-01-02 | Discharge: 2024-01-02 | Disposition: A | Discharge status: Home / Self Care

## 2024-01-02 ENCOUNTER — Ambulatory Visit: Payer: Self-pay

## 2024-01-02 DIAGNOSIS — L309 Dermatitis, unspecified: Secondary | ICD-10-CM | POA: Diagnosis not present

## 2024-01-02 MED ORDER — TRIAMCINOLONE ACETONIDE 0.1 % EX CREA: 1.0000 | TOPICAL_CREAM | Freq: Two times a day (BID) | CUTANEOUS | 0 refills | Status: DC

## 2024-01-02 MED ORDER — MUPIROCIN CALCIUM 2 % EX CREA: 1.0000 | TOPICAL_CREAM | Freq: Two times a day (BID) | CUTANEOUS | 0 refills | Status: DC

## 2024-01-02 NOTE — Discharge Instructions (Signed)
 We have sent in 2 different creams to help with your symptoms.  One is a topical steroid cream and 1 is a topical antibiotic cream.  We recommend following up with your primary care provider if symptoms are not improving.  Return or go to the emergency department if you have any significant pain or redness of the lymph nodes, fever, worsening symptoms, or if you have any other concerns.

## 2024-01-02 NOTE — ED Provider Notes (Signed)
 [facility]    CSN: [id number] Arrival date & time: 01/02/24  0841      History   Chief Complaint Chief Complaint  Patient presents with   Rash    HPI Emily Lucas is a 23 y.o. female.   Patient is a 23 year old female who presents to the urgent care today with concerns of scaling, irritation, and drainage from behind the back of both of her ears.  She also reports having some swollen lymph nodes currently but denies any pain or redness with them.  She reports fairly frequently having swollen lymph nodes that come and go. She reports having the symptoms behind her ears in the past and thinks it is due to wearing fake jewelry.  She tried putting some Neosporin on the area with no improvement.  She denies any fever, rash anywhere else, sore throat, weight loss, pain out of proportion, or other concerns at this time.    Past Medical History:  Diagnosis Date   Gallstones    Herpes simplex type 1 infection    per patient   Herpes simplex type 2 infection    per patient   Learning disability    comprehension- mother reported    Patient Active Problem List   Diagnosis Date Noted   Bacterial vaginosis 03/23/2023   Acute blood loss anemia 01/26/2020   Postpartum care following vaginal delivery 8/13 01/25/2020   Maternal anemia, with delivery - IDA with superimposed ABL 01/25/2020   Normal labor 01/24/2020   Supervision of normal first teen pregnancy 01/24/2020   Chorioamnionitis in third trimester, fetus 1 01/24/2020   Abnormal cholangiogram    S/P laparoscopic cholecystectomy 01/25/2017    Past Surgical History:  Procedure Laterality Date   cholecystecomy  01/24/2017   CHOLECYSTECTOMY     ERCP N/A 02/01/2017   Procedure: ENDOSCOPIC RETROGRADE CHOLANGIOPANCREATOGRAPHY (ERCP);  Surgeon: Teressa Toribio SQUIBB, MD;  Location[facility];  Service: Endoscopy;  Laterality: N/A;    OB History     Gravida  1   Para  1   Term  1   Preterm  0   AB  0    Living  1      SAB  0   IAB  0   Ectopic  0   Multiple  0   Live Births  1            Home Medications    Prior to Admission medications   Medication Sig Start Date End Date Taking? Authorizing Provider  mupirocin  cream (BACTROBAN ) 2 % Apply 1 Application topically 2 (two) times daily. 01/02/24  Yes Melonie Locus, PA-C  triamcinolone  cream (KENALOG ) 0.1 % Apply 1 Application topically 2 (two) times daily. 01/02/24  Yes Melonie Locus, PA-C  Etonogestrel  (NEXPLANON  [place]) Inject into the skin.    [provider]  iron  polysaccharides (FERREX 150) 150 MG capsule Take 1 capsule (150 mg total) by mouth daily. 01/27/20 04/30/20  Joshua Alan POUR, CNM    Family History Family History  Problem Relation Age of Onset   Healthy Mother    Healthy Father     Social History Social History   Tobacco Use   Smoking status: Never   Smokeless tobacco: Never  Vaping Use   Vaping status: Never Used  Substance Use Topics   Alcohol use: Never   Drug use: Never     Allergies   Motrin  [ibuprofen ]   Review of Systems Review of Systems See HPI for relevant ROS.  Physical Exam Triage Vital Signs ED Triage Vitals [01/02/24 0855]  Encounter Vitals Group     BP 97/63     Girls Systolic BP Percentile      Girls Diastolic BP Percentile      Boys Systolic BP Percentile      Boys Diastolic BP Percentile      Pulse Rate 75     Resp 18     Temp 98.5 F (36.9 C)     Temp Source Oral     SpO2 98 %     Weight      Height      Head Circumference      Peak Flow      Pain Score 0     Pain Loc      Pain Education      Exclude from Growth Chart    No data found.  Updated Vital Signs BP 97/63 (BP Location: Right Arm)   Pulse 75   Temp 98.5 F (36.9 C) (Oral)   Resp 18   SpO2 98%   Visual Acuity Right Eye Distance:   Left Eye Distance:   Bilateral Distance:    Right Eye Near:   Left Eye Near:    Bilateral Near:     Physical Exam General: Alert and  oriented, well-developed/well-nourished, calm, cooperative, no acute distress HEENT: Normocephalic atraumatic, moist mucous membranes, no scleral icterus, trachea midline, nontender cervical lymphadenopathy without erythema, scaling behind the ears bilaterally Lungs: Speaking full sentences, non-labored respirations, no distress Heart: Regular rate and rhythm Abdomen:  Soft, nondistended Musculoskeletal: Moves all extremities well Neurologic: Awake, A&O x4, gait normal Integumentary: Warm, dry, normal for ethnicity, intact, no rash Psychiatric: Appropriate mood & affect  UC Treatments / Results  Labs (all labs ordered are listed, but only abnormal results are displayed) Labs Reviewed - No data to display  EKG   Radiology No results found.  Procedures Procedures (including critical care time)  Medications Ordered in UC Medications - No data to display  Initial Impression / Assessment and Plan / UC Course  I have reviewed the triage vital signs and the nursing notes.  Pertinent labs & imaging results that were available during my care of the patient were reviewed by me and considered in my medical decision making (see chart for details).    Presents with scaling, irritation, and drainage from behind the ears.  Differential Diagnosis: Contact dermatitis, atopic dermatitis, tinea, drug-induced reaction, urticaria, burn, folliculitis, gout, herpes zoster, including other diagnoses.  Rationale: History and exam findings not consistent with dangerous etiologies of rash such as SJS/TEN, or secondary dangerous causes such as petechial rashes from thrombocytopenia or rickettsial infections. Rash does not appear urticarial with no signs of anaphylaxis either.  Prescribed patient triamcinolone  cream for coverage of contact dermatitis.  Additionally, as patient describes some yellow crusting and drainage, prescribed mupirocin  cream for coverage of possible impetigo.  Patient should follow-up  with their PCP in the next several days.  Discussed return precautions to the urgent care or emergency department including worsening of symptoms, fevers, pain out of proportion, systemic symptoms, or if they have any other concerns.  Disposition: Stable to discharge home.  All questions answered to the best of this examiner's ability. Reviewed possible severe sequelae and other reasons to return to urgent care or ED for further evaluation and/or treatment. Advised to f/u PCP w/in 48 to 72 hours for further eval and/or reassessment as needed. Patient voices understanding of the above and agrees  to plan.  An appropriate evaluation has been performed, and in my medical judgment there is currently no evidence of an immediate life-threatening or surgical condition. Discharge is therefore indicated at this time.  This document was created using the aid of voice recognition Scientist, clinical (histocompatibility and immunogenetics).  Final Clinical Impressions(s) / UC Diagnoses   Final diagnoses:  Dermatitis     Discharge Instructions      We have sent in 2 different creams to help with your symptoms.  One is a topical steroid cream and 1 is a topical antibiotic cream.  We recommend following up with your primary care provider if symptoms are not improving.  Return or go to the emergency department if you have any significant pain or redness of the lymph nodes, fever, worsening symptoms, or if you have any other concerns.    ED Prescriptions     Medication Sig Dispense Auth. Provider   mupirocin  cream (BACTROBAN ) 2 % Apply 1 Application topically 2 (two) times daily. 15 g Melonie Locus, PA-C   triamcinolone  cream (KENALOG ) 0.1 % Apply 1 Application topically 2 (two) times daily. 30 g Melonie Locus, PA-C      PDMP not reviewed this encounter.   Melonie Locus, PA-C 01/02/24 [id number]

## 2024-01-02 NOTE — ED Triage Notes (Signed)
 Pt present with a rash behind both ears x two days. States she feels itchy and has drainage. Pt states she wears fake jewelry and often has allergic reactions to it

## 2024-01-24 ENCOUNTER — Ambulatory Visit (INDEPENDENT_AMBULATORY_CARE_PROVIDER_SITE_OTHER): Admitting: Family Medicine

## 2024-01-24 ENCOUNTER — Other Ambulatory Visit (HOSPITAL_COMMUNITY)
Admission: RE | Admit: 2024-01-24 | Discharge: 2024-01-24 | Disposition: A | Discharge status: Home / Self Care | Source: Ambulatory Visit | Attending: Family Medicine | Admitting: Family Medicine

## 2024-01-24 VITALS — BP 98/68 | HR 70 | Temp 97.6°F | Ht 64.5 in | Wt 136.3 lb

## 2024-01-24 DIAGNOSIS — B9689 Other specified bacterial agents as the cause of diseases classified elsewhere: Secondary | ICD-10-CM

## 2024-01-24 DIAGNOSIS — Z7251 High risk heterosexual behavior: Secondary | ICD-10-CM

## 2024-01-24 DIAGNOSIS — N760 Acute vaginitis: Secondary | ICD-10-CM | POA: Diagnosis not present

## 2024-01-24 DIAGNOSIS — Z209 Contact with and (suspected) exposure to unspecified communicable disease: Secondary | ICD-10-CM

## 2024-01-24 MED ORDER — METRONIDAZOLE 0.75 % VA GEL
VAGINAL | Status: DC
Start: 2024-01-24 — End: 2024-03-02

## 2024-01-24 NOTE — Progress Notes (Signed)
   Established Patient Office Visit  Subjective   Patient ID: Emily Lucas, female    DOB: 03-24-2001  Age: 23 y.o. MRN: [id number]  Chief Complaint  Patient presents with   Requests STD testing-denies exposure    Patient states she would like to have STI testing, no symptoms like vaginal discharge changes or odor, however she thinks she might be getting a yeast infection, states that the discharge is a little more clumpy than normal.     Current Outpatient Medications  Medication Instructions   Etonogestrel  (NEXPLANON  [place]) Inject into the skin.   metroNIDAZOLE  (METROGEL ) 0.75 % vaginal gel APPLY 1 APPLICATOR FULL VAGINALLY AFTER INTERCOURSE AS NEEDED OR TWICE WEEKLY    Patient Active Problem List   Diagnosis Date Noted   Bacterial vaginosis 03/23/2023   Acute blood loss anemia 01/26/2020   Postpartum care following vaginal delivery 8/13 01/25/2020   Maternal anemia, with delivery - IDA with superimposed ABL 01/25/2020   Normal labor 01/24/2020   Supervision of normal first teen pregnancy 01/24/2020   Chorioamnionitis in third trimester, fetus 1 01/24/2020   Abnormal cholangiogram    S/P laparoscopic cholecystectomy 01/25/2017      Review of Systems  All other systems reviewed and are negative.     Objective:     BP 98/68   Pulse 70   Temp 97.6 F (36.4 C) (Oral)   Ht 5' 4.5 (1.638 m)   Wt 136 lb 4.8 oz (61.8 kg)   SpO2 98%   BMI 23.03 kg/m    Physical Exam Vitals reviewed.  Constitutional:      Appearance: Normal appearance. She is normal weight.  Eyes:     Conjunctiva/sclera: Conjunctivae normal.  Pulmonary:     Effort: Pulmonary effort is normal.  Neurological:     Mental Status: She is alert and oriented to person, place, and time. Mental status is at baseline.  Psychiatric:        Mood and Affect: Mood normal.        Behavior: Behavior normal.      No results found for any visits on 01/24/24.    The ASCVD Risk score (Arnett DK, et al.,  2019) failed to calculate for the following reasons:   The 2019 ASCVD risk score is only valid for ages 27 to 37    Assessment & Plan:  Unprotected sexual intercourse -     Cervicovaginal ancillary only -     HIV Antibody (routine testing w rflx); Future -     Hepatitis C antibody; Future -     RPR; Future  Exposure to communicable disease -     Cervicovaginal ancillary only -     HIV Antibody (routine testing w rflx); Future -     Hepatitis C antibody; Future -     RPR; Future  Bacterial vaginosis  Bacterial vaginitis -     metroNIDAZOLE ; APPLY 1 APPLICATOR FULL VAGINALLY AFTER INTERCOURSE AS NEEDED OR TWICE WEEKLY    Asymptomatic except for the vaginal discharge being more clumpy, Will retest for all STI's, patient also had questions about replacing her nexplanon  and I offered to have her schedule this with me in the office.   Return for nexplanon  removal and reinsertion.    Heron CHRISTELLA Sharper, MD

## 2024-01-25 LAB — CERVICOVAGINAL ANCILLARY ONLY
Bacterial Vaginitis (gardnerella): NEGATIVE
Candida Glabrata: NEGATIVE
Candida Vaginitis: NEGATIVE
Chlamydia: NEGATIVE
Comment: NEGATIVE
Comment: NEGATIVE
Comment: NEGATIVE
Comment: NEGATIVE
Comment: NEGATIVE
Comment: NORMAL
Neisseria Gonorrhea: NEGATIVE
Trichomonas: NEGATIVE

## 2024-01-26 ENCOUNTER — Ambulatory Visit: Payer: Self-pay | Admitting: Family Medicine

## 2024-01-26 ENCOUNTER — Other Ambulatory Visit (INDEPENDENT_AMBULATORY_CARE_PROVIDER_SITE_OTHER)

## 2024-01-26 DIAGNOSIS — A530 Latent syphilis, unspecified as early or late: Secondary | ICD-10-CM | POA: Diagnosis not present

## 2024-01-26 LAB — HEPATITIS C ANTIBODY: Hepatitis C Ab: NONREACTIVE

## 2024-01-26 LAB — RPR: RPR Ser Ql: REACTIVE — AB

## 2024-01-26 LAB — RPR TITER: RPR Titer: 1:1 {titer} — ABNORMAL HIGH

## 2024-01-26 LAB — HIV ANTIBODY (ROUTINE TESTING W REFLEX): HIV 1&2 Ab, 4th Generation: NONREACTIVE

## 2024-01-26 LAB — T PALLIDUM AB: T Pallidum Abs: NEGATIVE

## 2024-01-26 NOTE — Telephone Encounter (Signed)
 Pt was in office for labs today, asking that someone give her a call regarding her previous results

## 2024-01-26 NOTE — Progress Notes (Signed)
 Ok so her T palladium confirmatory test is actually negative-- this means that the RPR is a false positive and she does not have the infection. We will need to recheck her RPR in 4 weeks.

## 2024-01-29 LAB — T.PALLIDUM AB, TOTAL: T pallidum Antibodies (TP-PA): NONREACTIVE

## 2024-01-30 ENCOUNTER — Ambulatory Visit: Payer: Self-pay | Admitting: Family Medicine

## 2024-01-30 NOTE — Progress Notes (Signed)
 Final negative for the syphillis, she will need to return in 4 weeks for another RPR

## 2024-01-31 ENCOUNTER — Ambulatory Visit (INDEPENDENT_AMBULATORY_CARE_PROVIDER_SITE_OTHER): Admitting: Family Medicine

## 2024-01-31 ENCOUNTER — Encounter: Payer: Self-pay | Admitting: Family Medicine

## 2024-01-31 VITALS — BP 110/70 | HR 65 | Temp 97.9°F | Ht 64.5 in | Wt 135.7 lb

## 2024-01-31 DIAGNOSIS — A530 Latent syphilis, unspecified as early or late: Secondary | ICD-10-CM | POA: Diagnosis not present

## 2024-01-31 DIAGNOSIS — L739 Follicular disorder, unspecified: Secondary | ICD-10-CM

## 2024-01-31 DIAGNOSIS — Z30017 Encounter for initial prescription of implantable subdermal contraceptive: Secondary | ICD-10-CM

## 2024-01-31 DIAGNOSIS — Z3046 Encounter for surveillance of implantable subdermal contraceptive: Secondary | ICD-10-CM | POA: Diagnosis not present

## 2024-01-31 MED ORDER — ETONOGESTREL 68 MG ~~LOC~~ IMPL
68.0000 mg | DRUG_IMPLANT | Freq: Once | SUBCUTANEOUS | Status: AC
Start: 2024-01-31 — End: 2024-01-31
  Administered 2024-01-31: 68 mg via SUBCUTANEOUS

## 2024-01-31 MED ORDER — SULFAMETHOXAZOLE-TRIMETHOPRIM 200-40 MG/5ML PO SUSP: 10.0000 mL | Freq: Two times a day (BID) | ORAL | 0 refills | Status: DC

## 2024-01-31 MED ORDER — SULFAMETHOXAZOLE-TRIMETHOPRIM 800-160 MG PO TABS
1.0000 | ORAL_TABLET | Freq: Two times a day (BID) | ORAL | 0 refills | Status: DC
Start: 2024-01-31 — End: 2024-01-31

## 2024-01-31 NOTE — Progress Notes (Signed)
 Established Patient Office Visit  Subjective   Patient ID: Emily Lucas, female    DOB: 05-09-2001  Age: 23 y.o. MRN: [id number]  Chief Complaint  Patient presents with   Procedure   Rash    Axillary region, draining clear fluid    Pt is here for nexplanon  removal and insertion, pt states that her last nexplanon  was 3 years ago, states she likes this method of birth control and wishes to continue.  We discussed her false positive RPR test, confirmed negative with the treponemal test, will need to repeat testing in 4 weeks.   Pt is also reporting rash in her armpits, has been going on for about 2 days now, they are getting pimples and itching also. States she recently changed her deodorant and was switching to different razors and was getting itchy as well, then she noticed drainage from the skin. States that it is not odorous.    Current Outpatient Medications  Medication Instructions   Etonogestrel  (NEXPLANON  [place]) Inject into the skin.   metroNIDAZOLE  (METROGEL ) 0.75 % vaginal gel APPLY 1 APPLICATOR FULL VAGINALLY AFTER INTERCOURSE AS NEEDED OR TWICE WEEKLY   sulfamethoxazole -trimethoprim  (BACTRIM ) 200-40 MG/5ML suspension 10 mLs, Oral, 2 times daily       Review of Systems  All other systems reviewed and are negative.     Objective:     BP 110/70   Pulse 65   Temp 97.9 F (36.6 C) (Oral)   Ht 5' 4.5 (1.638 m)   Wt 135 lb 11.2 oz (61.6 kg)   SpO2 99%   BMI 22.93 kg/m    Physical Exam Vitals reviewed.  Constitutional:      Appearance: Normal appearance. She is normal weight.  Pulmonary:     Effort: Pulmonary effort is normal.  Abdominal:     General: Bowel sounds are normal.  Skin:    Findings: Rash (BL axilla there is raised confluent rash that is well demarcated with evidence of satellite lesions, there are no nodules, the skin is also cracked in some areas,  no darinage) present.  Neurological:     Mental Status: She is alert.   GYNECOLOGY CLINIC  PROCEDURE NOTE  Emily Lucas is a 23 y.o. G1P1001 here for Nexplanon  removal/ Nexplanon  insertion. No GYN concerns.  Nexplanon  Removal and Insertion  Patient was given informed consent for removal of her Implanon  and insertion of Nexplanon , time out was performed. Pregnancy test was not performed due to her already bring on the nexplanon . Appropriate time out taken. Nexplanon  site identified. Area prepped in usual sterile fashon. One ml of 1% lidocaine  with epi was used to anesthetize the area at the distal end of the implant. A small stab incision was made right beside the implant on the distal portion. The Nexplanon  rod was grasped using hemostats and removed without difficulty. There was minimal blood loss. There were no complications. Area was then injected with 3 ml of 1 % lidocaine . She was re-prepped with betadine, Nexplanon  removed from packaging, Device confirmed in needle, then inserted full length of needle and withdrawn per handbook instructions. Nexplanon  was able to palpated in the patient's arm; patient palpated the insert herself.  There was minimal blood loss. Patient insertion site covered with guaze and a pressure bandage to reduce any bruising. I did place 1 absorbable suture in the insertion site to keep it closed. The patient tolerated the procedure well and was given post procedure instructions.     No results found for  any visits on 01/31/24.    The ASCVD Risk score (Arnett DK, et al., 2019) failed to calculate for the following reasons:   The 2019 ASCVD risk score is only valid for ages 89 to 59    Assessment & Plan:   Positive RPR test False negative, will repeat test in 4 weeks  -     RPR w/reflex to TrepSure; Future  Folliculitis of axilla Could be bacterial or fungal, it is difficult to tell but the patient did report crusting and drainage which is not typical of a fungal infection. Will treat empirically with bactrim  DS, if there is no improvement she was  instructed to call me to switch treatments to an antifungal  -     Sulfamethoxazole -Trimethoprim ; Take 10 mLs by mouth 2 (two) times daily.  Dispense: 100 mL; Refill: 0  Encounter for removal and reinsertion of Nexplanon  [Z30.46] Patient tolerated her procedure well, I gave her wound care instructions and informed her that the suture should dissolve and come out on its own  -     Etonogestrel      No follow-ups on file.    Heron CHRISTELLA Sharper, MD

## 2024-01-31 NOTE — Patient Instructions (Signed)
 Can use neosporin ointment in the axilla or benzoyl peroxide wash

## 2024-02-02 ENCOUNTER — Telehealth: Payer: Self-pay | Admitting: *Deleted

## 2024-02-02 NOTE — Telephone Encounter (Signed)
 Spoke with [name] at the [facility] to confirm Emily Lucas worked with their office.  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   I spoke with Emily Lucas and informed him the test was performed with non-reactive result.

## 2024-02-02 NOTE — Telephone Encounter (Signed)
 Copied from CRM #[id number]. Topic: Clinical - Lab/Test Results >> Feb 01, 2024  3:27 PM Lavanda D wrote: Reason for CRM: Joane Shine with the state health dept[facility] is calling in regards to Ms. 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 He can be reached at: [phone]

## 2024-02-27 ENCOUNTER — Other Ambulatory Visit (INDEPENDENT_AMBULATORY_CARE_PROVIDER_SITE_OTHER)

## 2024-02-27 ENCOUNTER — Other Ambulatory Visit

## 2024-02-27 DIAGNOSIS — A530 Latent syphilis, unspecified as early or late: Secondary | ICD-10-CM | POA: Diagnosis not present

## 2024-02-29 ENCOUNTER — Encounter: Payer: Self-pay | Admitting: Family Medicine

## 2024-02-29 ENCOUNTER — Ambulatory Visit: Payer: Self-pay | Admitting: *Deleted

## 2024-02-29 ENCOUNTER — Ambulatory Visit (INDEPENDENT_AMBULATORY_CARE_PROVIDER_SITE_OTHER): Admitting: Family Medicine

## 2024-02-29 ENCOUNTER — Other Ambulatory Visit (HOSPITAL_COMMUNITY)
Admission: RE | Admit: 2024-02-29 | Discharge: 2024-02-29 | Disposition: A | Discharge status: Home / Self Care | Source: Ambulatory Visit | Attending: Family Medicine | Admitting: Family Medicine

## 2024-02-29 VITALS — BP 108/72 | HR 70 | Temp 98.0°F | Ht 64.5 in | Wt 131.0 lb

## 2024-02-29 DIAGNOSIS — N898 Other specified noninflammatory disorders of vagina: Secondary | ICD-10-CM | POA: Insufficient documentation

## 2024-02-29 DIAGNOSIS — Z113 Encounter for screening for infections with a predominantly sexual mode of transmission: Secondary | ICD-10-CM

## 2024-02-29 DIAGNOSIS — L309 Dermatitis, unspecified: Secondary | ICD-10-CM | POA: Diagnosis not present

## 2024-02-29 MED ORDER — KETOCONAZOLE 2 % EX SHAM: 1.0000 | MEDICATED_SHAMPOO | CUTANEOUS | 0 refills | Status: AC

## 2024-02-29 NOTE — Progress Notes (Signed)
 Established Patient Office Visit   Subjective:  Patient ID: Emily Lucas, female    DOB: Aug 29, 2000  Age: 23 y.o. MRN: [id number]  Chief Complaint  Patient presents with   Rash    Std testing  Labwork.     Rash   Patient is here for several complaints. Dr. Heron Sharper is patients primary care provider.  Patient is having a vaginal order and having a thin, white vaginal discharge. Started 2 days ago. Reports vaginal itching, but thinks it is related to her genital rash. She would like have STD testing. Denies any new exposure to STD.   Also, complaining of rash on her neck, forehead, and vaginal area. Rash on her neck started Sunday and rash on her vaginal area started Monday. She has pictures of her rash of her genitals on her phone (Ask to upload to MyChart). Itching. Tried using Triamcinolone  cream with little improvement.  She was treated for folliculitis of axilla on 08/19 with Bactrim  by Dr. Sharper. Patient reports that rash was leaking fluid, but this rash is not leaking fluid.   Review of Systems  Skin:  Positive for rash.   See HPI above     Objective:   BP 108/72   Pulse 70   Temp 98 F (36.7 C) (Oral)   Ht 5' 4.5 (1.638 m)   Wt 131 lb (59.4 kg)   SpO2 98%   BMI 22.14 kg/m    Physical Exam Vitals reviewed.  Constitutional:      General: She is not in acute distress.    Appearance: Normal appearance. She is not ill-appearing, toxic-appearing or diaphoretic.  Eyes:     General:        Right eye: No discharge.        Left eye: No discharge.     Conjunctiva/sclera: Conjunctivae normal.  Cardiovascular:     Rate and Rhythm: Normal rate.  Pulmonary:     Effort: Pulmonary effort is normal. No respiratory distress.  Musculoskeletal:        General: Normal range of motion.  Skin:    General: Skin is warm and dry.     Findings: Rash (See pictures of forehead and neck.) present.  Neurological:     General: No focal deficit present.     Mental  Status: She is alert and oriented to person, place, and time. Mental status is at baseline.  Psychiatric:        Mood and Affect: Mood normal.        Behavior: Behavior normal.        Thought Content: Thought content normal.        Judgment: Judgment normal.           Assessment & Plan:  Vaginal discharge -     Cervicovaginal ancillary only  Vaginal odor -     Cervicovaginal ancillary only  Screening for STD (sexually transmitted disease) -     HIV Antibody (routine testing w rflx) -     Hepatitis C antibody  Dermatitis -     Ketoconazole ; Apply 1 Application topically 2 (two) times a week.  Dispense: 120 mL; Refill: 0 -     Ambulatory referral to Dermatology  -Ordered vaginal testing for STD, yeast, and bacterial vaginosis due to vaginal discharge and odor. Office will call with lab results and will be available MyChart. -Ordered HIV and Hep C screening for STDs. Office will call with lab results and will be available MyChart.  -Prescribed  Ketoconazole  shampoo that can be used like a body wash twice a week for rash. -Placed a referral to dermatology for rashes. Please call the office or send a MyChart message if you do not receive a phone call or a MyChart message about appointment in 2 weeks.  -Follow up if not improved.   Selina Tapper, NP

## 2024-02-29 NOTE — Patient Instructions (Signed)
-  It was a pleasure to care for you today.  -Ordered vaginal testing for STD, yeast, and bacterial vaginosis due to vaginal discharge and odor. Office will call with lab results and will be available MyChart. -Ordered HIV and Hep C screening for STDs. Office will call with lab results and will be available MyChart.  -Prescribed Ketoconazole  shampoo that can be used like a body wash twice a week for rash. -Placed a referral to dermatology for rashes. Please call the office or send a MyChart message if you do not receive a phone call or a MyChart message about appointment in 2 weeks.  -Follow up if not improved.

## 2024-02-29 NOTE — Telephone Encounter (Signed)
 FYI Only or Action Required?: FYI only for provider.  Patient was last seen in primary care on 01/31/2024 by Emily Heron HERO, MD.  Called Nurse Triage reporting Rash.  Symptoms began chronic skin problem- flaring.  Interventions attempted: Prescription medications: steriod cream.  Symptoms are: unchanged.  Triage Disposition: See PCP When Office is Open (Within 3 Days)  Patient/caregiver understands and will follow disposition?: Yes   Reason for Disposition  Mild widespread rash  (Exception: Heat rash lasting 3 days or less.)  Answer Assessment - Initial Assessment Questions 1. APPEARANCE of RASH: What does the rash look like? (e.g., blisters, dry flaky skin, red spots, redness, sores)     Dermatitis flare 2. SIZE: How big are the spots? (e.g., tip of pen, eraser, coin; inches, centimeters)     Large area behind the neck- dry rough scaly Eczema  3. LOCATION: Where is the rash located?     Behind the neck  5. ONSET: When did the rash begin?     Sunday- using steriod cream 6. FEVER: Do you have a fever? If Yes, ask: What is your temperature, how was it measured, and when did it start?     no 7. ITCHING: Does the rash itch? If Yes, ask: How bad is the itch? (Scale 1-10; or mild, moderate, severe)     Scalp-5/10 8. CAUSE: What do you think is causing the rash?     Dermatitis  9. MEDICINE FACTORS: Have you started any new medicines within the last 2 weeks? (e.g., antibiotics)      no 10. OTHER SYMPTOMS: Do you have any other symptoms? (e.g., dizziness, headache, sore throat, joint pain)       Requesting STD testing  Protocols used: Rash or Redness - Widespread-A-AH   Copied from CRM #[id number]. Topic: Clinical - Red Word Triage >> Feb 29, 2024  8:28 AM Emily Lucas wrote: Red Word that prompted transfer to Nurse Triage: Patient initially wanted an appt for STD testing and blood work. States she has had a rash on her neck appear 2 days ago and has since  spread to different parts of her body.   Symptoms: itching, irritation, worsening rash

## 2024-02-29 NOTE — Telephone Encounter (Signed)
 Noted- ok to close.

## 2024-03-01 LAB — CERVICOVAGINAL ANCILLARY ONLY
Bacterial Vaginitis (gardnerella): POSITIVE — AB
Candida Glabrata: NEGATIVE
Candida Vaginitis: NEGATIVE
Chlamydia: NEGATIVE
Comment: NEGATIVE
Comment: NEGATIVE
Comment: NEGATIVE
Comment: NEGATIVE
Comment: NEGATIVE
Comment: NORMAL
Neisseria Gonorrhea: NEGATIVE
Trichomonas: NEGATIVE

## 2024-03-01 LAB — HIV ANTIBODY (ROUTINE TESTING W REFLEX)
HIV 1&2 Ab, 4th Generation: NONREACTIVE
HIV FINAL INTERPRETATION: NEGATIVE

## 2024-03-01 LAB — HEPATITIS C ANTIBODY: Hepatitis C Ab: NONREACTIVE

## 2024-03-02 ENCOUNTER — Ambulatory Visit (INDEPENDENT_AMBULATORY_CARE_PROVIDER_SITE_OTHER): Admitting: Family Medicine

## 2024-03-02 ENCOUNTER — Encounter: Payer: Self-pay | Admitting: Family Medicine

## 2024-03-02 ENCOUNTER — Ambulatory Visit: Payer: Self-pay | Admitting: Family Medicine

## 2024-03-02 VITALS — BP 88/60 | HR 75 | Temp 98.7°F | Ht 64.5 in | Wt 131.9 lb

## 2024-03-02 DIAGNOSIS — N760 Acute vaginitis: Secondary | ICD-10-CM

## 2024-03-02 DIAGNOSIS — B9689 Other specified bacterial agents as the cause of diseases classified elsewhere: Secondary | ICD-10-CM | POA: Diagnosis not present

## 2024-03-02 DIAGNOSIS — L2082 Flexural eczema: Secondary | ICD-10-CM | POA: Diagnosis not present

## 2024-03-02 LAB — RPR W/REFLEX TO TREPSURE

## 2024-03-02 MED ORDER — CLINDAMYCIN PHOSPHATE 2 % VA CREA
1.0000 | TOPICAL_CREAM | Freq: Every day | VAGINAL | Status: AC
Start: 2024-03-02 — End: 2024-03-09

## 2024-03-02 MED ORDER — PREDNISONE 5 MG/5ML PO SOLN
ORAL | 0 refills | Status: AC
Start: 2024-03-02 — End: 2024-03-14

## 2024-03-02 MED ORDER — METRONIDAZOLE 0.75 % VA GEL: Freq: Every day | VAGINAL | 5 refills | Status: AC

## 2024-03-02 NOTE — Progress Notes (Signed)
   Acute Office Visit  Subjective:     Patient ID: Emily Lucas, female    DOB: [birth year], 23 y.o.   MRN: [id number]  Chief Complaint  Patient presents with   Rash    Over the entire body x2 days    Rash   Patient is in today for persistent rash/ itchiness all over her body. She was seen 2 days ago and it was recommended for her to take ketoconazole  cream   Review of Systems  Skin:  Positive for rash.             Objective:    BP (!) 88/60   Pulse 75   Temp 98.7 F (37.1 C) (Oral)   Ht 5' 4.5 (1.638 m)   Wt 131 lb 14.4 oz (59.8 kg)   SpO2 96%   BMI 22.29 kg/m    Physical Exam Vitals reviewed.  Constitutional:      Appearance: Normal appearance. She is normal weight.  Skin:    Comments: See photos  Neurological:     Mental Status: She is alert.     No results found for any visits on 03/02/24.      Assessment & Plan:   Problem List Items Addressed This Visit   None Visit Diagnoses       Flexural eczema    -  Primary   Relevant Medications   predniSONE  5 MG/5ML solution     Bacterial vaginitis       Relevant Medications   metroNIDAZOLE  (METROGEL ) 0.75 % vaginal gel     The dermatitis is widespread over most of her body including her back, chest, arms and stomach. I recommend giving oral prednisone  taper to help reduce the rash and get on the cancellation list for the dermatologist (appt not until April 2026). Continue topical creams.   I adivsed also that her partner should be treated for BV since it is becoming recurrent for her. Refilled her BV script.   Meds ordered this encounter  Medications   metroNIDAZOLE  (METROGEL ) 0.75 % vaginal gel    Sig: Place vaginally at bedtime for 5 days. Then resume taking 1-2 times per week or after intercourse.    Dispense:  70 g    Refill:  5   predniSONE  5 MG/5ML solution    Sig: Take 40 mLs (40 mg total) by mouth daily with breakfast for 3 days, THEN 30 mLs (30 mg total) daily with breakfast for  3 days, THEN 20 mLs (20 mg total) daily with breakfast for 3 days, THEN 10 mLs (10 mg total) daily with breakfast for 3 days.    Dispense:  100 mL    Refill:  0    No follow-ups on file.  Heron CHRISTELLA Sharper, MD

## 2024-03-02 NOTE — Addendum Note (Signed)
 Addended by: ELNER NANNY B on: 03/02/2024 03:21 PM   Modules accepted: Orders

## 2024-03-05 ENCOUNTER — Telehealth: Payer: Self-pay | Admitting: *Deleted

## 2024-03-05 NOTE — Telephone Encounter (Signed)
 I called [facility] and spoke with Yancy, pharmacist as we have not received any information as below.  Yancy stated they only filled the initial Rx for what the provider wrote for on 9/19 and a new Rx was sent in today and per insurance it is too early to fill this. He stated to have the patient come back in once the initial Rx is completed.  Patient was informed of this and advised to contact Yancy at the pharmacy with further questions.

## 2024-03-05 NOTE — Telephone Encounter (Signed)
 Copied from CRM [id number]. Topic: General - Other >> Mar 05, 2024  9:50 AM Rosina BIRCH wrote: Reason for CRM: patient called stating the provider did not give her enough of the predniSONE  5 MG/5ML solution for twelve days and she only has enough for three doses. Her pharmacy does not have it so she want to see if the office can look and see if there is another [facility] [phone]

## 2024-03-06 ENCOUNTER — Ambulatory Visit: Payer: Self-pay | Admitting: Family Medicine

## 2024-03-06 LAB — TREPONEMAL ANTIBODIES, TPPA: Treponemal Antibodies, TPPA: NONREACTIVE

## 2024-03-06 LAB — RPR W/REFLEX TO TREPSURE: RPR: NONREACTIVE

## 2024-03-06 NOTE — Progress Notes (Signed)
 Repeat RPR is negative, confirming negative status

## 2024-03-07 ENCOUNTER — Ambulatory Visit: Admitting: Family Medicine

## 2024-03-14 ENCOUNTER — Ambulatory Visit (INDEPENDENT_AMBULATORY_CARE_PROVIDER_SITE_OTHER): Admitting: Family Medicine

## 2024-03-14 ENCOUNTER — Other Ambulatory Visit (HOSPITAL_COMMUNITY)
Admission: RE | Admit: 2024-03-14 | Discharge: 2024-03-14 | Disposition: A | Discharge status: Home / Self Care | Source: Ambulatory Visit | Attending: Family Medicine | Admitting: Family Medicine

## 2024-03-14 ENCOUNTER — Encounter: Payer: Self-pay | Admitting: Family Medicine

## 2024-03-14 VITALS — BP 90/60 | HR 72 | Temp 98.5°F | Ht 64.5 in | Wt 128.8 lb

## 2024-03-14 DIAGNOSIS — L2082 Flexural eczema: Secondary | ICD-10-CM | POA: Diagnosis not present

## 2024-03-14 DIAGNOSIS — Z7251 High risk heterosexual behavior: Secondary | ICD-10-CM

## 2024-03-14 MED ORDER — TACROLIMUS 0.1 % EX OINT: TOPICAL_OINTMENT | Freq: Two times a day (BID) | CUTANEOUS | 11 refills | Status: AC

## 2024-03-14 NOTE — Progress Notes (Signed)
 Established Patient Office Visit  Subjective   Patient ID: Emily Lucas, female    DOB: 2000-09-16  Age: 23 y.o. MRN: [id number]  Chief Complaint  Patient presents with   requests STD testing    Pt is here requesting retesting for STI's. She continues to have unprotected intercourse. She denies any fever/chills, no vaginal discharge, no pelvic pain.   Eczema -- pt reports the steroids helped with her rash. She does have residual hypopigmented patches on her legs but the rash has resolved. We discussed using tacrolimus cream and she is agreeable.       Current Outpatient Medications  Medication Instructions   Etonogestrel  (NEXPLANON  [place]) Inject into the skin.   ketoconazole  (NIZORAL ) 2 % shampoo 1 Application, Topical, 2 times weekly   predniSONE  5 MG/5ML solution Take 40 mLs (40 mg total) by mouth daily with breakfast for 3 days, THEN 30 mLs (30 mg total) daily with breakfast for 3 days, THEN 20 mLs (20 mg total) daily with breakfast for 3 days, THEN 10 mLs (10 mg total) daily with breakfast for 3 days.   tacrolimus (PROTOPIC) 0.1 % ointment Topical, 2 times daily    Patient Active Problem List   Diagnosis Date Noted   Bacterial vaginosis 03/23/2023   Acute blood loss anemia 01/26/2020   Postpartum care following vaginal delivery 8/13 01/25/2020   Maternal anemia, with delivery - IDA with superimposed ABL 01/25/2020   Normal labor 01/24/2020   Supervision of normal first teen pregnancy 01/24/2020   Chorioamnionitis in third trimester, fetus 1 01/24/2020   Abnormal cholangiogram    S/P laparoscopic cholecystectomy 01/25/2017     Review of Systems  All other systems reviewed and are negative.     Objective:     BP 90/60   Pulse 72   Temp 98.5 F (36.9 C) (Oral)   Ht 5' 4.5 (1.638 m)   Wt 128 lb 12.8 oz (58.4 kg)   SpO2 99%   BMI 21.77 kg/m    Physical Exam Vitals reviewed.  Constitutional:      Appearance: Normal appearance. She is normal weight.   Eyes:     Conjunctiva/sclera: Conjunctivae normal.  Pulmonary:     Effort: Pulmonary effort is normal.  Skin:    General: Skin is warm.     Findings: No rash.     Comments: Multiple scattered areas of hypopigmentation, no redness or inflammation seen.  Neurological:     Mental Status: She is alert and oriented to person, place, and time. Mental status is at baseline.  Psychiatric:        Mood and Affect: Mood normal.        Behavior: Behavior normal.      No results found for any visits on 03/14/24.    The ASCVD Risk score (Arnett DK, et al., 2019) failed to calculate for the following reasons:   The 2019 ASCVD risk score is only valid for ages 53 to 70    Assessment & Plan:  Unprotected sexual intercourse -     Cervicovaginal ancillary only -     HIV Antibody (routine testing w rflx); Future -     RPR; Future -     Hepatitis C antibody; Future  Flexural eczema -     Tacrolimus; Apply topically 2 (two) times daily.  Dispense: 100 g; Refill: 11   Will order testing that the patient is requesting, sent tacrolimus cream to the pharmacy,   No follow-ups on file.  Heron CHRISTELLA Sharper, MD

## 2024-03-15 LAB — RPR: RPR Ser Ql: NONREACTIVE

## 2024-03-15 LAB — HIV ANTIBODY (ROUTINE TESTING W REFLEX)
HIV 1&2 Ab, 4th Generation: NONREACTIVE
HIV FINAL INTERPRETATION: NEGATIVE

## 2024-03-15 LAB — CERVICOVAGINAL ANCILLARY ONLY
Bacterial Vaginitis (gardnerella): NEGATIVE
Candida Glabrata: NEGATIVE
Candida Vaginitis: POSITIVE — AB
Comment: NEGATIVE
Comment: NEGATIVE
Comment: NEGATIVE

## 2024-03-15 LAB — HEPATITIS C ANTIBODY: Hepatitis C Ab: NONREACTIVE

## 2024-03-19 ENCOUNTER — Ambulatory Visit: Payer: Self-pay | Admitting: Family Medicine

## 2024-03-20 ENCOUNTER — Ambulatory Visit: Admitting: Family Medicine

## 2024-10-02 ENCOUNTER — Ambulatory Visit: Admitting: Dermatology
